# Patient Record
Sex: Male | Born: 1975
Health system: Southern US, Community
[De-identification: ages and names within clinical notes are randomized; demographics above are authoritative.]

## PROBLEM LIST (undated history)

## (undated) DIAGNOSIS — M25519 Pain in unspecified shoulder: Secondary | ICD-10-CM

## (undated) DIAGNOSIS — F419 Anxiety disorder, unspecified: Secondary | ICD-10-CM

## (undated) DIAGNOSIS — F909 Attention-deficit hyperactivity disorder, unspecified type: Secondary | ICD-10-CM

## (undated) DIAGNOSIS — M1990 Unspecified osteoarthritis, unspecified site: Secondary | ICD-10-CM

## (undated) HISTORY — DX: Pain in unspecified shoulder: M25.519

## (undated) HISTORY — PX: NO PAST SURGERIES: SHX2092

---

## 2018-01-04 ENCOUNTER — Encounter: Payer: Self-pay | Admitting: Medical

## 2018-01-04 ENCOUNTER — Ambulatory Visit (INDEPENDENT_AMBULATORY_CARE_PROVIDER_SITE_OTHER): Payer: Commercial Managed Care - PPO | Admitting: Medical

## 2018-01-04 ENCOUNTER — Ambulatory Visit (HOSPITAL_BASED_OUTPATIENT_CLINIC_OR_DEPARTMENT_OTHER)
Admission: RE | Admit: 2018-01-04 | Discharge: 2018-01-04 | Disposition: A | Discharge status: Home / Self Care | Payer: Commercial Managed Care - PPO | Source: Ambulatory Visit | Attending: Medical | Admitting: Medical

## 2018-01-04 VITALS — BP 117/80 | HR 63 | Temp 98.2°F | Resp 16 | Ht 71.0 in | Wt 157.2 lb

## 2018-01-04 DIAGNOSIS — M2550 Pain in unspecified joint: Secondary | ICD-10-CM | POA: Diagnosis not present

## 2018-01-04 DIAGNOSIS — S30861D Insect bite (nonvenomous) of abdominal wall, subsequent encounter: Secondary | ICD-10-CM

## 2018-01-04 DIAGNOSIS — R41840 Attention and concentration deficit: Secondary | ICD-10-CM

## 2018-01-04 DIAGNOSIS — M25531 Pain in right wrist: Secondary | ICD-10-CM

## 2018-01-04 DIAGNOSIS — R5383 Other fatigue: Secondary | ICD-10-CM

## 2018-01-04 DIAGNOSIS — M25511 Pain in right shoulder: Secondary | ICD-10-CM | POA: Diagnosis not present

## 2018-01-04 DIAGNOSIS — M25532 Pain in left wrist: Secondary | ICD-10-CM | POA: Diagnosis not present

## 2018-01-04 DIAGNOSIS — W57XXXA Bitten or stung by nonvenomous insect and other nonvenomous arthropods, initial encounter: Secondary | ICD-10-CM | POA: Diagnosis not present

## 2018-01-04 DIAGNOSIS — G8929 Other chronic pain: Secondary | ICD-10-CM

## 2018-01-04 LAB — CBC WITH DIFFERENTIAL/PLATELET
BASOS ABS: 0 10*3/uL (ref 0.0–0.1)
Basophils Relative: 0.5 % (ref 0.0–3.0)
Eosinophils Absolute: 0 10*3/uL (ref 0.0–0.7)
Eosinophils Relative: 0.9 % (ref 0.0–5.0)
HCT: 43.4 % (ref 39.0–52.0)
Hemoglobin: 15 g/dL (ref 13.0–17.0)
LYMPHS ABS: 1.1 10*3/uL (ref 0.7–4.0)
Lymphocytes Relative: 21 % (ref 12.0–46.0)
MCHC: 34.5 g/dL (ref 30.0–36.0)
MCV: 89.1 fl (ref 78.0–100.0)
MONO ABS: 0.3 10*3/uL (ref 0.1–1.0)
Monocytes Relative: 5.6 % (ref 3.0–12.0)
NEUTROS ABS: 3.7 10*3/uL (ref 1.4–7.7)
NEUTROS PCT: 72 % (ref 43.0–77.0)
PLATELETS: 267 10*3/uL (ref 150.0–400.0)
RBC: 4.87 Mil/uL (ref 4.22–5.81)
RDW: 13.7 % (ref 11.5–15.5)
WBC: 5.2 10*3/uL (ref 4.0–10.5)

## 2018-01-04 LAB — COMPREHENSIVE METABOLIC PANEL
ALK PHOS: 59 U/L (ref 39–117)
ALT: 15 U/L (ref 0–53)
AST: 16 U/L (ref 0–37)
Albumin: 4.2 g/dL (ref 3.5–5.2)
BILIRUBIN TOTAL: 0.9 mg/dL (ref 0.2–1.2)
BUN: 18 mg/dL (ref 6–23)
CO2: 32 meq/L (ref 19–32)
Calcium: 9.2 mg/dL (ref 8.4–10.5)
Chloride: 105 mEq/L (ref 96–112)
Creatinine, Ser: 1.03 mg/dL (ref 0.40–1.50)
GFR: 85.01 mL/min (ref >60.00)
GLUCOSE: 93 mg/dL (ref 70–99)
Potassium: 4 mEq/L (ref 3.5–5.1)
SODIUM: 140 meq/L (ref 135–145)
TOTAL PROTEIN: 6.3 g/dL (ref 6.0–8.3)

## 2018-01-04 LAB — SEDIMENTATION RATE: SED RATE: 1 mm/h (ref 0–15)

## 2018-01-04 LAB — C-REACTIVE PROTEIN

## 2018-01-04 MED ORDER — DICLOFENAC SODIUM 75 MG PO TBEC: 75.0000 mg | DELAYED_RELEASE_TABLET | Freq: Two times a day (BID) | ORAL | 0 refills | Status: DC

## 2018-01-04 NOTE — Patient Instructions (Addendum)
For your history of poor concentration/attention issues since youth, I do want you to be evaluated by behavioral health for ADD testing.  We have given you a copy of the sheet with this a specialist numbers.  I marked on the top of that sheet by our behavioral health.  Please give them a call and initiate the referral.  If there is a delay in getting in with them please let me know and our referral staff can follow-up.  For your history of arthralgias, I have arthritis panel studies to be done today.  For fatigue, I have placed CBC, CMP, TSH, B12 and B1.  For your history of chronic right shoulder pain, I placed x-ray order today.  I also prescribed you diclofenac NSAID.  If you use diclofenac then stop any over-the-counter NSAIDs.  Will review your x-ray and depending on your response to diclofenac, I may refer you to sports medicine.  For history of very tiny bull's-eye appearing rash in July 2017, I did place orders for tick bite studies.  Follow-up in 3 weeks or as needed.

## 2018-01-04 NOTE — Progress Notes (Signed)
Subjective:    Patient ID: Corey Sosa, male    DOB: 12/01/1975, 42 y.o.   MRN: [id number]  HPI  Pt in for follow up.  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 Pt does not work out/exercise. Pt states he eats healthy for most part. Has some coffee in morning. None smoker. Alcohol 1 beer twice a week. Married- 3 children. Pt went to community college went for 4 semester.  Pt states he has history of rt shoulder pain in college. He states he lifted something heavy and felt stabbing pain. 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 Then 2 months ago pain seemed to come back. Now dull ache. States pain rt shoulder if lifts anything more than 15-20 lbs.  Pt also expresses some concern about some diffuse boyaches at times for 8 months with some fatigue. This is with all his joints. Pt does recall seeing rash that looks mild bulls eye like rash January 2017 but does not remember seeing anything. Rt was on rt lower rib area.  Pt also stats his brother has ADD. Pt states he had always had problems with attention and concentration. Also thinks he has problems remembering.     Review of Systems  Constitutional: Positive for fatigue.  Respiratory: Negative for apnea, cough, chest tightness, shortness of breath and wheezing.   Cardiovascular: Negative for chest pain and palpitations.  Gastrointestinal: Negative for abdominal pain and blood in stool.  Musculoskeletal: Positive for arthralgias and myalgias.  Skin: Positive for rash.       Small bulls eye rash about 1/2 inch rt rib area numerous months ago.  Actually was July 2017.  Neurological: Negative for dizziness, seizures, facial asymmetry and numbness.  Hematological: Negative for adenopathy. Does not bruise/bleed easily.  Psychiatric/Behavioral: Negative for behavioral problems, confusion and decreased concentration.   Past Medical History:  Diagnosis Date  .  Shoulder pain      Social History   Socioeconomic History  . Marital status: Married    Spouse name: Not on file  . Number of children: Not on file  . Years of education: Not on file  . Highest education level: Not on file  Social Needs  . Financial resource strain: Not on file  . Food insecurity - worry: Not on file  . Food insecurity - inability: Not on file  . Transportation needs - medical: Not on file  . Transportation needs - non-medical: Not on file  Occupational History  . Not on file  Tobacco Use  . Smoking status: Never Smoker  . Smokeless tobacco: Never Used  Substance and Sexual Activity  . Alcohol use: Yes    Frequency: Never    Comment: 1 beer twice a week.  . Drug use: No  . Sexual activity: Yes  Other Topics Concern  . Not on file  Social History Narrative  . Not on file      Family History  Problem Relation Age of Onset  . Cancer Mother   . Stroke Father   . Heart attack Father     Not on File  No current outpatient medications on file prior to visit.   No current facility-administered medications on file prior to visit.     BP 117/80   Pulse 63   Temp 98.2 F (36.8 C) (Oral)   Resp 16   Ht 5\' 11"  (1.803 m)   Wt 157 lb  3.2 oz (71.3 kg)   SpO2 99%   BMI 21.92 kg/m       Objective:   Physical Exam  General Mental Status- Alert. General Appearance- Not in acute distress.   Skin General: Color- Normal Color. Moisture- Normal Moisture.  Upon inspection no rash presently.  Particularly none in the right lower rib region.  Neck Carotid Arteries- Normal color. Moisture- Normal Moisture. No carotid bruits. No JVD.  Chest and Lung Exam Auscultation: Breath Sounds:-Normal.  Cardiovascular Auscultation:Rythm- Regular. Murmurs & Other Heart Sounds:Auscultation of the heart reveals- No Murmurs.  Abdomen Inspection:-Inspeection Normal. Palpation/Percussion:Note:No mass. Palpation and Percussion of the abdomen reveal- Non Tender,  Non Distended + BS, no rebound or guarding.    Neurologic Cranial Nerve exam:- CN Sosa-XII intact(No nystagmus), symmetric smile. Strength:- 5/5 equal and symmetric strength both upper and lower extremities.   Right shoulder- he does have some tenderness to palpation directly over the anterior shoulder in the region and in the region of the biceps tendon.  Good range of motion but when he abducts his right upper extremity to the shoulder level he does report pain.  Musculoskeletal-upon inspection of wrist and hands no obvious deformity.  Good range of motion of wrist and digits.    Assessment & Plan:  For your history of poor concentration/attention issues since youth, I do want you to be evaluated by behavioral health for ADD testing.  We have given you a copy of the sheet with this a specialist numbers.  I marked on the top of that sheet by our behavioral health.  Please give them a call and initiate the referral.  If there is a delay in getting in with them please let me know and our referral staff can follow-up.  For your history of arthralgias, I have arthritis panel studies to be done today.  For fatigue, I have placed CBC, CMP, TSH, B12 and B1.  For your history of chronic right shoulder pain, I placed x-ray order today.  I also prescribed you diclofenac NSAID.  If you use diclofenac then stop any over-the-counter NSAIDs.  Will review your x-ray and depending on your response to diclofenac, I may refer you to sports medicine.  For history of very tiny bull's-eye appearing rash in July 2017, I did place orders for tick bite studies.  Follow-up in 3 weeks or as needed.

## 2018-01-05 DIAGNOSIS — M2550 Pain in unspecified joint: Secondary | ICD-10-CM | POA: Insufficient documentation

## 2018-01-05 DIAGNOSIS — R41840 Attention and concentration deficit: Secondary | ICD-10-CM | POA: Insufficient documentation

## 2018-01-05 LAB — VITAMIN B12: Vitamin B-12: 243 pg/mL (ref 211–911)

## 2018-01-05 LAB — TSH: TSH: 2.5 u[IU]/mL (ref 0.35–4.50)

## 2018-01-06 ENCOUNTER — Telehealth: Payer: Self-pay | Admitting: Medical

## 2018-01-06 NOTE — Telephone Encounter (Signed)
error 

## 2018-01-07 LAB — ROCKY MTN SPOTTED FVR ABS PNL(IGG+IGM)
RMSF IGM: NOT DETECTED
RMSF IgG: NOT DETECTED

## 2018-01-07 LAB — VITAMIN B1: Vitamin B1 (Thiamine): 13 nmol/L (ref 8–30)

## 2018-01-07 LAB — ANA: ANA: NEGATIVE

## 2018-01-07 LAB — RHEUMATOID FACTOR

## 2018-01-11 ENCOUNTER — Telehealth: Payer: Self-pay | Admitting: Medical

## 2018-01-11 NOTE — Telephone Encounter (Signed)
Will you double check with lab why lyme study was not run? Place reorder for that and please arrange for patient to come and get that done. Let me know what lab says about whey it was not run?

## 2018-01-11 NOTE — Telephone Encounter (Signed)
Looking in patient's chart it looks like the lyme disease study was dc due to test not being collected in lab.

## 2018-01-11 NOTE — Telephone Encounter (Signed)
What happened to lyme disease studies.

## 2018-01-11 NOTE — Telephone Encounter (Signed)
Will you ask lab to reorder lyme study. I will sign and arrange for pt to come in and get blood test.

## 2018-01-19 ENCOUNTER — Other Ambulatory Visit: Payer: Self-pay | Admitting: Emergency Medicine

## 2018-01-19 ENCOUNTER — Telehealth: Payer: Self-pay | Admitting: Medical

## 2018-01-19 ENCOUNTER — Encounter: Payer: Self-pay | Admitting: Medical

## 2018-01-19 DIAGNOSIS — M2550 Pain in unspecified joint: Secondary | ICD-10-CM

## 2018-01-19 NOTE — Telephone Encounter (Signed)
Looking at the patient's labs that were drawn over the past 2 weeks.  Patient had tick bite studies done and not can almost where I remember signing off on Lyme studies.  But those are not back yet?  I had asked Rod Holler to touch base with Mar[facility] [place] about the results since they were pending but did not get a response.  Patient is now wondering why it is taking so long to get the lab study results back.  Will you see if that was ever sent out?Marland Kitchen  Will you touch base with Marita [place] tomorrow.

## 2018-01-19 NOTE — Telephone Encounter (Signed)
Open to review Lyme study order.

## 2018-01-25 ENCOUNTER — Encounter: Payer: Self-pay | Admitting: Medical

## 2018-01-25 ENCOUNTER — Telehealth: Payer: Self-pay | Admitting: Medical

## 2018-01-25 ENCOUNTER — Ambulatory Visit (INDEPENDENT_AMBULATORY_CARE_PROVIDER_SITE_OTHER): Payer: Commercial Managed Care - PPO | Admitting: Medical

## 2018-01-25 VITALS — BP 122/88 | HR 67 | Resp 16 | Ht 71.0 in | Wt 159.8 lb

## 2018-01-25 DIAGNOSIS — S20361A Insect bite (nonvenomous) of right front wall of thorax, initial encounter: Secondary | ICD-10-CM

## 2018-01-25 DIAGNOSIS — Z113 Encounter for screening for infections with a predominantly sexual mode of transmission: Secondary | ICD-10-CM | POA: Diagnosis not present

## 2018-01-25 DIAGNOSIS — M2550 Pain in unspecified joint: Secondary | ICD-10-CM

## 2018-01-25 DIAGNOSIS — R5383 Other fatigue: Secondary | ICD-10-CM

## 2018-01-25 DIAGNOSIS — R0683 Snoring: Secondary | ICD-10-CM | POA: Diagnosis not present

## 2018-01-25 DIAGNOSIS — W57XXXS Bitten or stung by nonvenomous insect and other nonvenomous arthropods, sequela: Secondary | ICD-10-CM

## 2018-01-25 DIAGNOSIS — J029 Acute pharyngitis, unspecified: Secondary | ICD-10-CM | POA: Diagnosis not present

## 2018-01-25 LAB — POCT RAPID STREP A (OFFICE): Rapid Strep A Screen: POSITIVE — AB

## 2018-01-25 LAB — VITAMIN D 25 HYDROXY (VIT D DEFICIENCY, FRACTURES): VITD: 10.42 ng/mL — ABNORMAL LOW (ref 30.00–100.00)

## 2018-01-25 MED ORDER — VITAMIN D (ERGOCALCIFEROL) 1.25 MG (50000 UNIT) PO CAPS: 50000.0000 [IU] | ORAL_CAPSULE | ORAL | 0 refills | Status: DC

## 2018-01-25 MED ORDER — AMOXICILLIN 500 MG PO CAPS: 500.0000 mg | ORAL_CAPSULE | Freq: Three times a day (TID) | ORAL | 0 refills | Status: DC

## 2018-01-25 NOTE — Progress Notes (Signed)
Subjective:    Patient ID: Corey Sosa, male    DOB: 03-10-1976, 42 y.o.   MRN: [id number]  HPI  Pt has appointment with behavioral health on February 05, 2018. For ADD evaluation.  Pt did contact lab about why Lyme studies were not sent out. Pt small rash area has resolved.Pt tick bite was January 2017.   Pt states still having some body aches and still having fatigue. Fatigue despites adequate rest. Arthritis panel work up was negative.  Prior work up for fatigue was negative. I did not include vitamin D level.  Pt does note his body aches are less.  On review patient states that he never got mono.  Pt also mentioned that in the past he did participate in some drug trials.(pt was told he took most of drugs got approved.)   Review of Systems  Constitutional: Positive for fatigue. Negative for activity change, appetite change and fever.  HENT: Positive for sore throat. Negative for congestion, ear pain, nosebleeds and postnasal drip.        About one week ago throat felt swollen for 2 days. Mild pain.   Pt does snore just little bite.  Respiratory: Negative for cough, chest tightness, shortness of breath and wheezing.   Cardiovascular: Negative for chest pain and palpitations.  Musculoskeletal: Negative for back pain.  Skin: Negative for rash.  Neurological: Negative for dizziness, speech difficulty, weakness, numbness and headaches.  Hematological: Negative for adenopathy. Does not bruise/bleed easily.  Psychiatric/Behavioral: Negative for behavioral problems.     Past Medical History:  Diagnosis Date  . Shoulder pain      Social History   Socioeconomic History  . Marital status: Married    Spouse name: Not on file  . Number of children: Not on file  . Years of education: Not on file  . Highest education level: Not on file  Occupational History  . Not on file  Social Needs  . Financial resource strain: Not on file  . Food insecurity:    Worry: Not on  file    Inability: Not on file  . Transportation needs:    Medical: Not on file    Non-medical: Not on file  Tobacco Use  . Smoking status: Never Smoker  . Smokeless tobacco: Never Used  Substance and Sexual Activity  . Alcohol use: Yes    Frequency: Never    Comment: 1 beer twice a week.  . Drug use: No  . Sexual activity: Yes  Lifestyle  . Physical activity:    Days per week: Not on file    Minutes per session: Not on file  . Stress: Not on file  Relationships  . Social connections:    Talks on phone: Not on file    Gets together: Not on file    Attends religious service: Not on file    Active member of club or organization: Not on file    Attends meetings of clubs or organizations: Not on file    Relationship status: Not on file  . Intimate partner violence:    Fear of current or ex partner: Not on file    Emotionally abused: Not on file    Physically abused: Not on file    Forced sexual activity: Not on file  Other Topics Concern  . Not on file  Social History Narrative  . Not on file      Family History  Problem Relation Age of Onset  . Cancer Mother   .  Stroke Father   . Heart attack Father     Not on File  No current outpatient medications on file prior to visit.   No current facility-administered medications on file prior to visit.     BP 122/88 (BP Location: Right Arm, Patient Position: Sitting, Cuff Size: Normal)   Pulse 67   Resp 16   Ht 5\' 11"  (1.803 m)   Wt 159 lb 12.8 oz (72.5 kg)   SpO2 100%   BMI 22.29 kg/m       Objective:   Physical Exam  General Mental Status- Alert. General Appearance- Not in acute distress.   Skin General: Color- Normal Color. Moisture- Normal Moisture.  Neck Carotid Arteries- Normal color. Moisture- Normal Moisture. No carotid bruits. No JVD.  Chest and Lung Exam Auscultation: Breath Sounds:-Normal.  Cardiovascular Auscultation:Rythm- Regular. Murmurs & Other Heart Sounds:Auscultation of the  heart reveals- No Murmurs.  Abdomen Inspection:-Inspeection Normal. Palpation/Percussion:Note:No mass. Palpation and Percussion of the abdomen reveal- Non Tender, Non Distended + BS, no rebound or guarding.   Neurologic Cranial Nerve exam:- CN Sosa-XII intact(No nystagmus), symmetric smile. Strength:- 5/5 equal and symmetric strength both upper and lower extremities.  HEENT- mild red not real swollen   Assessment & Plan:  For hx of remote tick bit will get lyme studies. Sorry for mix up ordering the test.  For fatigue will add vitamin D, and epstein barr test.  Will get hiv screen.  For st one week ago we got rapid strep test was faint positive. Will rx amoxicillin.  Will follow labs and consider snoring as cause. If all test negative.   Follow up date to be determined after lab review.  Mackie Pai, PA-C

## 2018-01-25 NOTE — Patient Instructions (Addendum)
.   For hx of remote tick bit will get lyme studies. Sorry for mix up ordering the test.  For fatigue will add vitamin D, and epstein barr test.  Will get hiv screen.  For st one week ago we got rapid strep test was faint positive. Will rx amoxicillin.  Will follow labs and consider snoring as cause. If all test negative.   Follow up date to be determined after lab review.

## 2018-01-25 NOTE — Telephone Encounter (Signed)
Prescription of vitamin D sent to patient's pharmacy.

## 2018-01-26 LAB — EPSTEIN-BARR VIRUS VCA ANTIBODY PANEL
EBV NA IGG: 174 U/mL — AB
EBV VCA IGG: 639 U/mL — AB
EBV VCA IgM: <36 U/mL

## 2018-01-26 LAB — B. BURGDORFI ANTIBODIES: B burgdorferi Ab IgG+IgM: <0.9 index

## 2018-01-26 LAB — HIV ANTIBODY (ROUTINE TESTING W REFLEX): HIV: NONREACTIVE

## 2018-02-05 ENCOUNTER — Ambulatory Visit (INDEPENDENT_AMBULATORY_CARE_PROVIDER_SITE_OTHER): Payer: Commercial Managed Care - PPO | Admitting: Psychology

## 2018-02-05 DIAGNOSIS — F900 Attention-deficit hyperactivity disorder, predominantly inattentive type: Secondary | ICD-10-CM

## 2018-02-08 ENCOUNTER — Encounter: Payer: Self-pay | Admitting: Medical

## 2018-02-15 ENCOUNTER — Ambulatory Visit (INDEPENDENT_AMBULATORY_CARE_PROVIDER_SITE_OTHER): Payer: Commercial Managed Care - PPO | Admitting: Psychology

## 2018-02-15 DIAGNOSIS — F900 Attention-deficit hyperactivity disorder, predominantly inattentive type: Secondary | ICD-10-CM

## 2018-02-23 ENCOUNTER — Telehealth: Payer: Self-pay | Admitting: Medical

## 2018-02-23 NOTE — Telephone Encounter (Signed)
Did review psychologist

## 2018-02-23 NOTE — Telephone Encounter (Signed)
Did review psychologist note on ADHD evaluation. Will you schedule pt appointment to discuss options. Potentially sign controlled medication contract.

## 2018-02-24 NOTE — Telephone Encounter (Signed)
Could you call and schedule appointment.  

## 2018-02-24 NOTE — Telephone Encounter (Signed)
Left voicemail for pt to call us back to set up an appointment.

## 2018-03-01 ENCOUNTER — Ambulatory Visit (INDEPENDENT_AMBULATORY_CARE_PROVIDER_SITE_OTHER): Payer: Commercial Managed Care - PPO | Admitting: Medical

## 2018-03-01 ENCOUNTER — Encounter: Payer: Self-pay | Admitting: Medical

## 2018-03-01 VITALS — BP 117/86 | HR 64 | Temp 98.2°F | Resp 16 | Ht 71.0 in | Wt 161.2 lb

## 2018-03-01 DIAGNOSIS — F988 Other specified behavioral and emotional disorders with onset usually occurring in childhood and adolescence: Secondary | ICD-10-CM

## 2018-03-01 DIAGNOSIS — Z79899 Other long term (current) drug therapy: Secondary | ICD-10-CM

## 2018-03-01 MED ORDER — METHYLPHENIDATE HCL ER 18 MG PO TB24
18.0000 mg | ORAL_TABLET | Freq: Every day | ORAL | 0 refills | Status: DC
Start: 2018-03-01 — End: 2018-03-29

## 2018-03-01 MED ORDER — VITAMIN D (ERGOCALCIFEROL) 1.25 MG (50000 UNIT) PO CAPS: 50000.0000 [IU] | ORAL_CAPSULE | ORAL | 0 refills | Status: DC

## 2018-03-01 NOTE — Progress Notes (Signed)
Subjective:    Patient ID: Corey Sosa, male    DOB: [birth year], 42 y.o.   MRN: [id number]  HPI  Pt in for follow up.  On review report of psychologist and report indicate he has ADD and that he may  benefit from medication. Pt never been on any medication before for ADD. Pt bp good and no history of palpitations. He does not drink a lot of caffeine beverages.  Pt does not smoke any marijuana. No drugs. Only rare alcohol use.    Review of Systems  Constitutional: Negative for chills, diaphoresis, fatigue and fever.  Respiratory: Negative for cough, chest tightness, shortness of breath and wheezing.   Cardiovascular: Negative for chest pain and palpitations.  Gastrointestinal: Negative for abdominal pain.  Genitourinary: Negative for dysuria, flank pain and frequency.  Musculoskeletal: Negative for back pain and joint swelling.  Skin: Negative for pallor.  Neurological: Negative for dizziness and headaches.  Hematological: Negative for adenopathy. Does not bruise/bleed easily.  Psychiatric/Behavioral: Positive for decreased concentration. Negative for agitation, behavioral problems, self-injury and sleep disturbance. The patient is not nervous/anxious.     Past Medical History:  Diagnosis Date  . Shoulder pain      Social History   Socioeconomic History  . Marital status: Married    Spouse name: Not on file  . Number of children: Not on file  . Years of education: Not on file  . Highest education level: Not on file  Occupational History  . Not on file  Social Needs  . Financial resource strain: Not on file  . Food insecurity:    Worry: Not on file    Inability: Not on file  . Transportation needs:    Medical: Not on file    Non-medical: Not on file  Tobacco Use  . Smoking status: Never Smoker  . Smokeless tobacco: Never Used  Substance and Sexual Activity  . Alcohol use: Yes    Frequency: Never    Comment: 1 beer twice a week.  . Drug use: No  .  Sexual activity: Yes  Lifestyle  . Physical activity:    Days per week: Not on file    Minutes per session: Not on file  . Stress: Not on file  Relationships  . Social connections:    Talks on phone: Not on file    Gets together: Not on file    Attends religious service: Not on file    Active member of club or organization: Not on file    Attends meetings of clubs or organizations: Not on file    Relationship status: Not on file  . Intimate partner violence:    Fear of current or ex partner: Not on file    Emotionally abused: Not on file    Physically abused: Not on file    Forced sexual activity: Not on file  Other Topics Concern  . Not on file  Social History Narrative  . Not on file    No past surgical history on file.  Family History  Problem Relation Age of Onset  . Cancer Mother   . Stroke Father   . Heart attack Father     Not on File  No current outpatient medications on file prior to visit.   No current facility-administered medications on file prior to visit.     BP 117/86 (BP Location: Left Arm, Patient Position: Sitting, Cuff Size: Small)   Pulse 64   Temp 98.2 F (36.8 C) (  Oral)   Resp 16   Ht 5\' 11"  (1.803 m)   Wt 161 lb 3.2 oz (73.1 kg)   SpO2 99%   BMI 22.48 kg/m       Objective:   Physical Exam   General- No acute distress. Pleasant patient. Neck- Full range of motion, no jvd Lungs- Clear, even and unlabored. Heart- regular rate and rhythm. Neurologic- CNII- XII grossly intact.     Assessment & Plan:  For ADD I did prescribe you methylphenidate. You signed contract today and gave UDS.  Will follow up in one month to see response to your medication.  For low vitamin D refilled the RX and repeat vitamin D in one month.  Follow up in one month or as needed  [employer], PA-C

## 2018-03-01 NOTE — Patient Instructions (Signed)
For ADD I did prescribe you methylphenidate. You signed contract today and gave UDS.  Will follow up in one month to see response to your medication.  For low vitamin D refilled the RX and repeat vitamin D in one month.  Follow up in one month or as needed

## 2018-03-02 LAB — PAIN MGMT, PROFILE 8 W/CONF, U
6 Acetylmorphine: NEGATIVE ng/mL (ref <10)
AMPHETAMINES: NEGATIVE ng/mL (ref <500)
Alcohol Metabolites: NEGATIVE ng/mL (ref <500)
Benzodiazepines: NEGATIVE ng/mL (ref <100)
Buprenorphine, Urine: NEGATIVE ng/mL (ref <5)
Cocaine Metabolite: NEGATIVE ng/mL (ref <150)
Creatinine: 65.6 mg/dL
MDMA: NEGATIVE ng/mL (ref <500)
Marijuana Metabolite: NEGATIVE ng/mL (ref <20)
OXIDANT: NEGATIVE ug/mL (ref <200)
OXYCODONE: NEGATIVE ng/mL (ref <100)
Opiates: NEGATIVE ng/mL (ref <100)
pH: 6.58 (ref 4.5–9.0)

## 2018-03-29 ENCOUNTER — Ambulatory Visit (INDEPENDENT_AMBULATORY_CARE_PROVIDER_SITE_OTHER): Payer: Commercial Managed Care - PPO | Admitting: Medical

## 2018-03-29 ENCOUNTER — Encounter: Payer: Self-pay | Admitting: Medical

## 2018-03-29 VITALS — BP 118/81 | HR 82 | Temp 98.1°F | Resp 16 | Ht 71.0 in | Wt 155.6 lb

## 2018-03-29 DIAGNOSIS — F988 Other specified behavioral and emotional disorders with onset usually occurring in childhood and adolescence: Secondary | ICD-10-CM

## 2018-03-29 MED ORDER — METHYLPHENIDATE HCL ER (OSM) 27 MG PO TBCR: 27.0000 mg | EXTENDED_RELEASE_TABLET | ORAL | 0 refills | Status: DC

## 2018-03-29 NOTE — Progress Notes (Signed)
Subjective:    Patient ID: Corey Sosa, male    DOB: 1975-11-26, 42 y.o.   MRN: [id number]  HPI  Pt in for follow up.   Pt states for first 3 days he states could feel significant difference with concerta. It has helped some moderately . He states very efficient in getting thinks done and could concentrate very well first 3 days. He feel like could benefit from higher dose. States after 3 days he was wondering if might need slightly higher dose.  He really did not have a lot of side effects.   Review of Systems  Constitutional: Negative for chills, diaphoresis, fatigue and unexpected weight change.  Respiratory: Negative for apnea, cough, choking, shortness of breath and wheezing.   Cardiovascular: Negative for chest pain and palpitations.  Gastrointestinal: Negative for abdominal pain.  Musculoskeletal: Negative for back pain.  Neurological: Negative for dizziness, speech difficulty, weakness, numbness and headaches.  Hematological: Negative for adenopathy. Does not bruise/bleed easily.  Psychiatric/Behavioral: Positive for decreased concentration. Negative for behavioral problems, dysphoric mood, sleep disturbance and suicidal ideas. The patient is not nervous/anxious and is not hyperactive.     Past Medical History:  Diagnosis Date  . Shoulder pain      Social History   Socioeconomic History  . Marital status: Married    Spouse name: Not on file  . Number of children: Not on file  . Years of education: Not on file  . Highest education level: Not on file  Occupational History  . Not on file  Social Needs  . Financial resource strain: Not on file  . Food insecurity:    Worry: Not on file    Inability: Not on file  . Transportation needs:    Medical: Not on file    Non-medical: Not on file  Tobacco Use  . Smoking status: Never Smoker  . Smokeless tobacco: Never Used  Substance and Sexual Activity  . Alcohol use: Yes    Frequency: Never    Comment: 1  beer twice a week.  . Drug use: No  . Sexual activity: Yes  Lifestyle  . Physical activity:    Days per week: Not on file    Minutes per session: Not on file  . Stress: Not on file  Relationships  . Social connections:    Talks on phone: Not on file    Gets together: Not on file    Attends religious service: Not on file    Active member of club or organization: Not on file    Attends meetings of clubs or organizations: Not on file    Relationship status: Not on file  . Intimate partner violence:    Fear of current or ex partner: Not on file    Emotionally abused: Not on file    Physically abused: Not on file    Forced sexual activity: Not on file  Other Topics Concern  . Not on file  Social History Narrative  . Not on file    No past surgical history on file.  Family History  Problem Relation Age of Onset  . Cancer Mother   . Stroke Father   . Heart attack Father     Not on File  Current Outpatient Medications on File Prior to Visit  Medication Sig Dispense Refill  . methylphenidate 18 MG PO CR tablet Take 1 tablet (18 mg total) by mouth daily. 30 tablet 0  . Vitamin D, Ergocalciferol, (DRISDOL) 50000 units CAPS  capsule Take 1 capsule (50,000 Units total) by mouth every 7 (seven) days. 4 capsule 0   No current facility-administered medications on file prior to visit.     BP 118/81   Pulse 82   Temp 98.1 F (36.7 C) (Oral)   Resp 16   Ht 5\' 11"  (1.803 m)   Wt 155 lb 9.6 oz (70.6 kg)   SpO2 98%   BMI 21.70 kg/m       Objective:   Physical Exam  General Mental Status- Alert. General Appearance- Not in acute distress.   Skin General: Color- Normal Color. Moisture- Normal Moisture.  Neck Carotid Arteries- Normal color. Moisture- Normal Moisture. No carotid bruits. No JVD.  Chest and Lung Exam Auscultation: Breath Sounds:-Normal.  Cardiovascular Auscultation:Rythm- Regular. Murmurs & Other Heart Sounds:Auscultation of the heart reveals- No  Murmurs.  Abdomen Inspection:-Inspeection Normal. Palpation/Percussion:Note:No mass. Palpation and Percussion of the abdomen reveal- Non Tender, Non Distended + BS, no rebound or guarding.  Neurologic Cranial Nerve exam:- CN Sosa-XII intact(No nystagmus), symmetric smile. Drift Test:- No drift. Finger to Nose:- Normal/Intact Strength:- 5/5 equal and symmetric strength both upper and lower extremities.      Assessment & Plan:  ADD is improved but not ideally, so I am increasing dose today. I think you will do well with this dose. Can fill rx on April 01, 2018.  Please my chart me update in one month.    If everything going well will refill med when needed and you can follow up in September or October.  Mackie Pai, PA-C

## 2018-03-29 NOTE — Patient Instructions (Addendum)
ADD is improved but not ideally, so I am increasing dose today. I think you will do well with this dose. Can fill rx on April 01, 2018.  Please my chart me update in one month.    If everything going well will refill med when needed and you can follow up in September or October.

## 2018-05-05 ENCOUNTER — Other Ambulatory Visit: Payer: Self-pay | Admitting: Medical

## 2018-05-06 ENCOUNTER — Encounter: Payer: Self-pay | Admitting: Medical

## 2018-05-06 ENCOUNTER — Telehealth: Payer: Self-pay | Admitting: Medical

## 2018-05-06 MED ORDER — METHYLPHENIDATE HCL ER (OSM) 27 MG PO TBCR
27.0000 mg | EXTENDED_RELEASE_TABLET | ORAL | 0 refills | Status: DC
Start: 2018-05-06 — End: 2018-06-11

## 2018-05-06 MED ORDER — VITAMIN D (ERGOCALCIFEROL) 1.25 MG (50000 UNIT) PO CAPS: 50000.0000 [IU] | ORAL_CAPSULE | ORAL | 5 refills | Status: DC

## 2018-05-06 NOTE — Telephone Encounter (Signed)
Refill concerta sent to pt pharmacy.

## 2018-06-03 ENCOUNTER — Encounter: Payer: Self-pay | Admitting: Medical

## 2018-06-08 ENCOUNTER — Encounter: Payer: Self-pay | Admitting: Medical

## 2018-06-11 ENCOUNTER — Other Ambulatory Visit: Payer: Self-pay | Admitting: Medical

## 2018-06-11 MED ORDER — METHYLPHENIDATE HCL ER (OSM) 27 MG PO TBCR: 27.0000 mg | EXTENDED_RELEASE_TABLET | ORAL | 0 refills | Status: DC

## 2018-06-11 NOTE — Telephone Encounter (Signed)
Did refill pt concerta.

## 2018-06-11 NOTE — Telephone Encounter (Signed)
Copied from [place] [id number]. Topic: Quick Communication - Rx Refill/Question >> Jun 11, 2018  8:32 AM Margot Ables wrote: Medication: methylphenidate (CONCERTA) 27 MG PO CR tablet - pt sent mychart msgs requesting refill on 06/03/18 and 06/08/18. Pt has been out of medication since 06/07/18 and asking why no response.  Has the patient contacted their pharmacy? No - controlled Preferred Pharmacy (with phone number or street name): [id number] - [place], [place] - [address] AT [address] [phone] (Phone) [phone] (Fax)

## 2018-06-11 NOTE — Telephone Encounter (Signed)
Requesting: Concerta 27mg  qday Contract: 2019 UDS: 03/01/18 Last OV: 03/29/18 Next Ov: N/A Last refill: 05/06/18, 30, 0RF Database: no discrepancies noted.  Please advise.

## 2018-07-12 ENCOUNTER — Encounter: Payer: Self-pay | Admitting: Medical

## 2018-07-13 ENCOUNTER — Telehealth: Payer: Self-pay | Admitting: Medical

## 2018-07-13 MED ORDER — METHYLPHENIDATE HCL ER (OSM) 27 MG PO TBCR: 27.0000 mg | EXTENDED_RELEASE_TABLET | ORAL | 0 refills | Status: DC

## 2018-07-13 NOTE — Telephone Encounter (Signed)
Refilled concerta. Asked pt to go ahead and schedule follow up next month.

## 2018-07-13 NOTE — Telephone Encounter (Signed)
Pt is requesting refill on Concerta  Last OV: 05/01/7208 Last Fill: 06/11/2018 #30 and 0RF UDS: 03/01/2018

## 2018-08-05 ENCOUNTER — Telehealth: Payer: Self-pay | Admitting: Medical

## 2018-08-05 ENCOUNTER — Encounter: Payer: Self-pay | Admitting: Medical

## 2018-08-05 ENCOUNTER — Other Ambulatory Visit: Payer: Self-pay

## 2018-08-05 DIAGNOSIS — Z79899 Other long term (current) drug therapy: Secondary | ICD-10-CM

## 2018-08-05 NOTE — Telephone Encounter (Signed)
Patient is in process of getting a new job.  He expresses that on previous job they did urine drug screen and it came back positive for narcotic.  He had a very old prescription for which he used for back pain and his former employer let him go since it was not recent prescription.  Now he is getting higher by new company and they want a letter reassuring that he does not have controlled medication/narcotic abuse problems.  I talk with patient today and stated that he could come in to get a urine drug screen tomorrow and I could review that.  If comes up negative as expected then I could write a letter stating he is under treatment for concentration issues and needs random UDS on occasion.  I can explain that drug screening test have all come back negative and that I do not expect/suspect patient has any substance abuse issues.  So I will review the UDS that he does tomorrow and then go over with him the letter with him once that his prepared.

## 2018-08-05 NOTE — Telephone Encounter (Signed)
Opened to review 

## 2018-08-06 ENCOUNTER — Other Ambulatory Visit (INDEPENDENT_AMBULATORY_CARE_PROVIDER_SITE_OTHER): Payer: Self-pay

## 2018-08-06 DIAGNOSIS — Z79899 Other long term (current) drug therapy: Secondary | ICD-10-CM

## 2018-08-07 LAB — PAIN MGMT, PROFILE 8 W/CONF, U
6 Acetylmorphine: NEGATIVE ng/mL (ref <10)
Alcohol Metabolites: NEGATIVE ng/mL (ref <500)
Amphetamines: NEGATIVE ng/mL (ref <500)
BENZODIAZEPINES: NEGATIVE ng/mL (ref <100)
BUPRENORPHINE, URINE: NEGATIVE ng/mL (ref <5)
Cocaine Metabolite: NEGATIVE ng/mL (ref <150)
Creatinine: 201.7 mg/dL
MDMA: NEGATIVE ng/mL (ref <500)
Marijuana Metabolite: NEGATIVE ng/mL (ref <20)
OPIATES: NEGATIVE ng/mL (ref <100)
Oxidant: NEGATIVE ug/mL (ref <200)
Oxycodone: NEGATIVE ng/mL (ref <100)
PH: 5.55 (ref 4.5–9.0)

## 2018-08-09 ENCOUNTER — Ambulatory Visit: Payer: Commercial Managed Care - PPO | Admitting: Medical

## 2018-08-09 ENCOUNTER — Telehealth: Payer: Self-pay | Admitting: Medical

## 2018-08-09 NOTE — Telephone Encounter (Signed)
Will you take note of printer. If I chose wrong printer will you print it and give to me. thanks

## 2018-08-09 NOTE — Telephone Encounter (Signed)
Printed letter for pt. Not sure if I sent to write printer. So will you reprint if not printed. Then give to me to sign.

## 2018-08-11 ENCOUNTER — Encounter: Payer: Self-pay | Admitting: Medical

## 2018-08-26 ENCOUNTER — Encounter: Payer: Self-pay | Admitting: Medical

## 2018-09-07 ENCOUNTER — Encounter: Payer: Self-pay | Admitting: Medical

## 2018-09-08 ENCOUNTER — Telehealth: Payer: Self-pay | Admitting: Medical

## 2018-09-08 MED ORDER — METHYLPHENIDATE HCL ER (OSM) 27 MG PO TBCR: 27.0000 mg | EXTENDED_RELEASE_TABLET | ORAL | 0 refills | Status: DC

## 2018-09-08 NOTE — Telephone Encounter (Signed)
Refilled pt ADD med. Want him to follow up in one month.

## 2018-09-08 NOTE — Telephone Encounter (Signed)
I sent in concerta to pt pharmacy

## 2018-10-16 ENCOUNTER — Encounter: Payer: Self-pay | Admitting: Medical

## 2018-10-19 ENCOUNTER — Telehealth: Payer: Self-pay | Admitting: Medical

## 2018-10-19 MED ORDER — METHYLPHENIDATE HCL ER (OSM) 27 MG PO TBCR: 27.0000 mg | EXTENDED_RELEASE_TABLET | ORAL | 0 refills | Status: DC

## 2018-10-19 NOTE — Telephone Encounter (Signed)
Rx concerta sent to pt pharmacy. 

## 2018-11-18 ENCOUNTER — Encounter: Payer: Self-pay | Admitting: Medical

## 2018-12-28 ENCOUNTER — Encounter: Payer: Self-pay | Admitting: Medical

## 2018-12-28 ENCOUNTER — Ambulatory Visit: Payer: Self-pay | Admitting: Medical

## 2018-12-28 VITALS — BP 120/84 | HR 78 | Temp 98.2°F | Resp 16 | Ht 71.0 in | Wt 162.8 lb

## 2018-12-28 DIAGNOSIS — F988 Other specified behavioral and emotional disorders with onset usually occurring in childhood and adolescence: Secondary | ICD-10-CM

## 2018-12-28 MED ORDER — METHYLPHENIDATE HCL ER (OSM) 27 MG PO TBCR: 27.0000 mg | EXTENDED_RELEASE_TABLET | ORAL | 0 refills | Status: DC

## 2018-12-28 NOTE — Patient Instructions (Addendum)
Your ADD was well controlled when on concerta. Some concentration issues recently when you were off meds due to insurance lapse. No adverse side effects reported on meds. Will refill you meds again today. Up to date on contract and uds presently.  Recommend that you follow up in May for wellness exam. At that point can update contract again and give uds.  Follow up sooner if needed

## 2018-12-28 NOTE — Progress Notes (Signed)
Subjective:    Patient ID: Corey Sosa, male    DOB: [birth year], 43 y.o.   MRN: [id number]  HPI Pt in today for a follow up.  He has new job working in Mining engineer.  He has history of ADD. He is up to date on his contract for controlled meds. Up to date on uds. Pt last got concerta refill on [id number]. Pt ran out of medication since new job and insurance change. He has insurance that will start on Apr1l, 2020.  When he was on medication he did well with concentration. With med/insurance laps add symptoms returned.  Pt controlled med profile reviewed today. No other controlled meds filled.    Review of Systems  Constitutional: Negative for chills, fatigue and fever.  HENT: Negative for congestion, drooling and ear pain.   Respiratory: Negative for cough, chest tightness, shortness of breath and wheezing.   Cardiovascular: Negative for chest pain and palpitations.  Gastrointestinal: Negative for abdominal distention, abdominal pain, diarrhea and nausea.  Genitourinary: Negative for dysuria, frequency and genital sores.  Musculoskeletal: Negative for back pain, joint swelling and neck stiffness.  Skin: Negative for rash.  Neurological: Negative for dizziness, speech difficulty, weakness and headaches.  Hematological: Negative for adenopathy. Does not bruise/bleed easily.  Psychiatric/Behavioral: Positive for decreased concentration. Negative for behavioral problems, dysphoric mood, hallucinations, sleep disturbance and suicidal ideas. The patient is not nervous/anxious and is not hyperactive.     Past Medical History:  Diagnosis Date  . Shoulder pain      Social History   Socioeconomic History  . Marital status: Married    Spouse name: Not on file  . Number of children: Not on file  . Years of education: Not on file  . Highest education level: Not on file  Occupational History  . Not on file  Social Needs  . Financial resource strain: Not on file  . Food  insecurity:    Worry: Not on file    Inability: Not on file  . Transportation needs:    Medical: Not on file    Non-medical: Not on file  Tobacco Use  . Smoking status: Never Smoker  . Smokeless tobacco: Never Used  Substance and Sexual Activity  . Alcohol use: Yes    Frequency: Never    Comment: 1 beer twice a week.  . Drug use: No  . Sexual activity: Yes  Lifestyle  . Physical activity:    Days per week: Not on file    Minutes per session: Not on file  . Stress: Not on file  Relationships  . Social connections:    Talks on phone: Not on file    Gets together: Not on file    Attends religious service: Not on file    Active member of club or organization: Not on file    Attends meetings of clubs or organizations: Not on file    Relationship status: Not on file  . Intimate partner violence:    Fear of current or ex partner: Not on file    Emotionally abused: Not on file    Physically abused: Not on file    Forced sexual activity: Not on file  Other Topics Concern  . Not on file  Social History Narrative  . Not on file    No past surgical history on file.  Family History  Problem Relation Age of Onset  . Cancer Mother   . Stroke Father   . Heart attack Father  Not on File  Current Outpatient Medications on File Prior to Visit  Medication Sig Dispense Refill  . methylphenidate (CONCERTA) 27 MG PO CR tablet Take 1 tablet (27 mg total) by mouth every morning. Can give brand. 30 tablet 0  . Vitamin D, Ergocalciferol, (DRISDOL) 50000 units CAPS capsule Take 1 capsule (50,000 Units total) by mouth every 7 (seven) days. 4 capsule 5   No current facility-administered medications on file prior to visit.     BP 120/84   Pulse 78   Temp 98.2 F (36.8 C) (Oral)   Resp 16   Ht 5\' 11"  (1.803 m)   Wt 162 lb 12.8 oz (73.8 kg)   SpO2 98%   BMI 22.71 kg/m       Objective:   Physical Exam  General Mental Status- Alert. General Appearance- Not in acute  distress.   Skin General: Color- Normal Color. Moisture- Normal Moisture.  Neck Carotid Arteries- Normal color. Moisture- Normal Moisture. No carotid bruits. No JVD.  Chest and Lung Exam Auscultation: Breath Sounds:-Normal.  Cardiovascular Auscultation:Rythm- Regular. Murmurs & Other Heart Sounds:Auscultation of the heart reveals- No Murmurs.  Abdomen Inspection:-Inspeection Normal. Palpation/Percussion:Note:No mass. Palpation and Percussion of the abdomen reveal- Non Tender, Non Distended + BS, no rebound or guarding.   Neurologic Cranial Nerve exam:- CN Sosa-XII intact(No nystagmus), symmetric smile. Strength:- 5/5 equal and symmetric strength both upper and lower extremities.     Assessment & Plan:  Your ADD was well controlled when on concerta. Some concentration issues recently when you were off meds due to insurance lapse. No adverse side effects reported on meds. Will refill you meds again today. Up to date on contract and uds presently.  Recommend that you follow up in May for wellness exam. At that point can update contract again and give uds.  Follow 2 months  up sooner if needed  [employer], PA-C

## 2019-01-25 ENCOUNTER — Encounter: Payer: Self-pay | Admitting: Medical

## 2019-01-26 ENCOUNTER — Telehealth: Payer: Self-pay | Admitting: Medical

## 2019-01-26 MED ORDER — METHYLPHENIDATE HCL ER (OSM) 27 MG PO TBCR: 27.0000 mg | EXTENDED_RELEASE_TABLET | ORAL | 0 refills | Status: DC

## 2019-01-26 NOTE — Telephone Encounter (Signed)
Rx concerta sent to pt pharmacy.

## 2019-02-28 ENCOUNTER — Other Ambulatory Visit: Payer: Self-pay

## 2019-02-28 ENCOUNTER — Encounter: Payer: Self-pay | Admitting: Medical

## 2019-02-28 ENCOUNTER — Ambulatory Visit (INDEPENDENT_AMBULATORY_CARE_PROVIDER_SITE_OTHER): Payer: Medicaid Other | Admitting: Medical

## 2019-02-28 VITALS — BP 117/78 | HR 60 | Wt 160.0 lb

## 2019-02-28 DIAGNOSIS — F988 Other specified behavioral and emotional disorders with onset usually occurring in childhood and adolescence: Secondary | ICD-10-CM

## 2019-02-28 MED ORDER — METHYLPHENIDATE HCL ER (OSM) 27 MG PO TBCR: 27.0000 mg | EXTENDED_RELEASE_TABLET | ORAL | 0 refills | Status: DC

## 2019-02-28 NOTE — Patient Instructions (Addendum)
Patient has ADD and doing well with current medication regimen.  No reported adverse side effects other than mild decreased appetite.  He is due for refill of his concerta having run out of med about 3 days ago. Up to date on uds and contract. Reviewed controlled med website today.  Sent new rx concerta in.  Asked pt to schedule follow up late July or August for cpe/wellness

## 2019-02-28 NOTE — Progress Notes (Signed)
   Subjective:    Patient ID: Corey Sosa, male    DOB: 01/05/1976, 43 y.o.   MRN: [id number]  HPI  Virtual Visit via Video Note  I connected with Corey Sosa on 02/28/19 at  4:00 PM EDT by a video enabled telemedicine application and verified that I am speaking with the correct person using two identifiers.  Location: Patient: home Provider: office.   I discussed the limitations of evaluation and management by telemedicine and the availability of in person appointments. The patient expressed understanding and agreed to proceed.   History of Present Illness:   Pt doing well on ADD medication with no side effects. Only slight decreased appetitie. No high blood pressure. No palpitation. Weight is stable. He states medcation working well to help with concentration. He is satisfied with current dose.   Observations/Objective: No acute distress. Pleasant speaking normally. Alert and oriented. Lungs- unlabored on inspection. Neuro- normal gross motor function appears intact.  Assessment and Plan:   Follow Up Instructions:    I discussed the assessment and treatment plan with the patient. The patient was provided an opportunity to ask questions and all were answered. The patient agreed with the plan and demonstrated an understanding of the instructions.   The patient was advised to call back or seek an in-person evaluation if the symptoms worsen or if the condition fails to improve as anticipated.     Mackie Pai, PA-C   Review of Systems     Objective:   Physical Exam        Assessment & Plan:

## 2019-03-01 ENCOUNTER — Telehealth: Payer: Self-pay | Admitting: Medical

## 2019-03-01 NOTE — Telephone Encounter (Signed)
LVM FOR PT TO CALL AND SCHEDULE CPE

## 2019-04-05 ENCOUNTER — Encounter: Payer: Self-pay | Admitting: Medical

## 2019-04-06 ENCOUNTER — Telehealth: Payer: Self-pay | Admitting: Medical

## 2019-04-06 MED ORDER — METHYLPHENIDATE HCL ER (OSM) 27 MG PO TBCR: 27.0000 mg | EXTENDED_RELEASE_TABLET | ORAL | 0 refills | Status: DC

## 2019-04-06 NOTE — Telephone Encounter (Signed)
Refilled pt concerta.

## 2019-05-05 ENCOUNTER — Telehealth: Payer: Self-pay | Admitting: Medical

## 2019-05-05 ENCOUNTER — Encounter: Payer: Self-pay | Admitting: Medical

## 2019-05-05 MED ORDER — METHYLPHENIDATE HCL ER (OSM) 27 MG PO TBCR: 27.0000 mg | EXTENDED_RELEASE_TABLET | ORAL | 0 refills | Status: DC

## 2019-05-05 NOTE — Telephone Encounter (Signed)
Refilled concerta. Tried to review [employer] data [employer]. Internal service error showed. No meds? Not sure what that means. Web site down?

## 2019-06-09 ENCOUNTER — Encounter: Payer: Self-pay | Admitting: Medical

## 2019-06-14 MED ORDER — METHYLPHENIDATE HCL ER (OSM) 27 MG PO TBCR: 27.0000 mg | EXTENDED_RELEASE_TABLET | ORAL | 0 refills | Status: DC

## 2019-06-14 NOTE — Addendum Note (Signed)
Addended by: Hinton Dyer on: 06/14/2019 02:30 PM   Modules accepted: Orders

## 2019-06-15 ENCOUNTER — Telehealth: Payer: Self-pay | Admitting: Medical

## 2019-06-15 MED ORDER — METHYLPHENIDATE HCL ER (OSM) 27 MG PO TBCR: 27.0000 mg | EXTENDED_RELEASE_TABLET | ORAL | 0 refills | Status: DC

## 2019-06-15 NOTE — Telephone Encounter (Signed)
Rx concerta sent to pharmacy.

## 2019-06-23 ENCOUNTER — Telehealth: Payer: Self-pay | Admitting: Medical

## 2019-06-23 NOTE — Telephone Encounter (Signed)
LVM for pt to reschedule appt on 08-01, provider not going to be in office in the am, can reschedule on sameday in the afternoon in person appt. If not can reschedule another day at pt convenience.

## 2019-06-28 ENCOUNTER — Ambulatory Visit: Payer: Medicaid Other | Admitting: Medical

## 2019-06-28 ENCOUNTER — Other Ambulatory Visit: Payer: Self-pay

## 2019-06-28 ENCOUNTER — Encounter: Payer: Self-pay | Admitting: Medical

## 2019-06-28 ENCOUNTER — Ambulatory Visit (INDEPENDENT_AMBULATORY_CARE_PROVIDER_SITE_OTHER): Payer: Medicaid Other | Admitting: Medical

## 2019-06-28 VITALS — BP 117/79 | HR 61

## 2019-06-28 DIAGNOSIS — F988 Other specified behavioral and emotional disorders with onset usually occurring in childhood and adolescence: Secondary | ICD-10-CM

## 2019-06-28 MED ORDER — CARVEDILOL 25 MG PO TABS: 25.0000 mg | ORAL_TABLET | Freq: Two times a day (BID) | ORAL | 3 refills | Status: DC

## 2019-06-28 NOTE — Progress Notes (Signed)
   Subjective:    Patient ID: Corey Sosa, male    DOB: February 13, 1976, 43 y.o.   MRN: [id number]  HPI  Virtual Visit via Video Note  I connected with Corey Sosa on 06/28/19 at  2:40 PM EDT by a video enabled telemedicine application and verified that I am speaking with the correct person using two identifiers.  Location: Patient: work Provider: office   I discussed the limitations of evaluation and management by telemedicine and the availability of in person appointments. The patient expressed understanding and agreed to proceed.  Pt up to date on drug screen. Up to date on contract.  History of Present Illness:  Pt states concentration is good. No significant side effect other than decreased side effect.   Observations/Objective: General-no acute distress, pleasant, oriented. Lungs- on inspection lungs appear unlabored. Neck- no tracheal deviation or jvd on inspection. Neuro- gross motor function appears intact.    Assessment and Plan:  ADHD well controlled. Continue concerta. Refill when due.  Do want you to get scheduled for update of contract and uds mid October.  Also that day we can get flu vaccine but when call to schedule contract update explain you want to get flu vaccine same day.  Follow up date for next controlled med visit in 4 months or as needed Follow Up Instructions:    I discussed the assessment and treatment plan with the patient. The patient was provided an opportunity to ask questions and all were answered. The patient agreed with the plan and demonstrated an understanding of the instructions.   The patient was advised to call back or seek an in-person evaluation if the symptoms worsen or if the condition fails to improve as anticipated.  I provided 15  minutes of non-face-to-face time during this encounter.   Mackie Pai, PA-C   Review of Systems  Constitutional: Negative for fatigue and fever.  Respiratory: Negative for  cough, chest tightness, shortness of breath and wheezing.   Cardiovascular: Negative for chest pain and palpitations.  Gastrointestinal: Negative for abdominal pain.  Neurological: Negative for dizziness, speech difficulty and light-headedness.  Hematological: Negative for adenopathy. Does not bruise/bleed easily.  Psychiatric/Behavioral: Positive for decreased concentration. Negative for dysphoric mood, sleep disturbance and suicidal ideas. The patient is not nervous/anxious.        But does well on meds.       Objective:   Physical Exam        Assessment & Plan:

## 2019-06-28 NOTE — Patient Instructions (Addendum)
ADHD well controlled. Continue concerta. Refill when due.  Do want you to get scheduled for update of contract and uds mid October.  Also that day we can get flu vaccine but when call to schedule contract update explain you want to get flu vaccine same day.  Follow up date for next controlled med visit in 4 months or as needed

## 2019-07-18 ENCOUNTER — Encounter: Payer: Self-pay | Admitting: Medical

## 2019-07-19 ENCOUNTER — Telehealth: Payer: Self-pay | Admitting: Medical

## 2019-07-19 ENCOUNTER — Encounter: Payer: Self-pay | Admitting: Medical

## 2019-07-19 DIAGNOSIS — Z79899 Other long term (current) drug therapy: Secondary | ICD-10-CM

## 2019-07-19 MED ORDER — METHYLPHENIDATE HCL ER (OSM) 27 MG PO TBCR: 27.0000 mg | EXTENDED_RELEASE_TABLET | ORAL | 0 refills | Status: DC

## 2019-07-19 NOTE — Telephone Encounter (Signed)
Refill Request: Concerta  Last [id number] Last OV:06/28/19 Next [id number] scheduled  UDS:08/06/18 CSC:08/06/18 CSR:

## 2019-07-19 NOTE — Telephone Encounter (Signed)
Please get pt scheduled to sign controlled med contract and to give uds. Needs to be done by Aug 07, 2019.  No appointment with me needed See if he wants to get flu vaccine that day also.

## 2019-07-20 NOTE — Telephone Encounter (Signed)
Edward sent [place] to pt to schedule lab/UDS and needs to sign new contract. I LM as well to remind pt.

## 2019-07-20 NOTE — Addendum Note (Signed)
Addended by: Hinton Dyer on: 07/20/2019 11:08 AM   Modules accepted: Orders

## 2019-07-20 NOTE — Telephone Encounter (Signed)
Pt needs labs appointment for UDS

## 2019-07-26 ENCOUNTER — Other Ambulatory Visit: Payer: Self-pay

## 2019-07-27 ENCOUNTER — Ambulatory Visit (INDEPENDENT_AMBULATORY_CARE_PROVIDER_SITE_OTHER): Payer: Medicaid Other | Admitting: Medical

## 2019-07-27 DIAGNOSIS — Z23 Encounter for immunization: Secondary | ICD-10-CM | POA: Diagnosis not present

## 2019-07-27 DIAGNOSIS — Z79899 Other long term (current) drug therapy: Secondary | ICD-10-CM | POA: Diagnosis not present

## 2019-07-27 DIAGNOSIS — F988 Other specified behavioral and emotional disorders with onset usually occurring in childhood and adolescence: Secondary | ICD-10-CM

## 2019-07-27 DIAGNOSIS — M5442 Lumbago with sciatica, left side: Secondary | ICD-10-CM | POA: Diagnosis not present

## 2019-07-27 NOTE — Patient Instructions (Addendum)
You are doing well with concerta for ADD. Update contract today and uds today. Continue concerta.  If back pain worsens and more constant then can offer lumbar spine. By desciption pain appears occasional sciatica. Will put in future lumbar spine xray. Can get today if you want.(or you can call [facility] and ask for copy of your old xray done related to work injury) Can continue conservative measures.  Follow up 4 months or as needed.

## 2019-07-27 NOTE — Progress Notes (Signed)
Subjective:    Patient ID: Corey Sosa, male    DOB: 10/26/1976, 43 y.o.   MRN: [id number]  HPI  Patient here for routine follow-up for ADD. He is taking Concernta 27mg  and reports it is working well. No desire to change medications or dosages. Reports only side effect is decreased appetite. He drinks a protein shake for breakfast, tries to eat lunch most days, and normal dinner. Reports weight is maintaining.  Flu shot today.  Pt up to date on uds and contract done today.   Review of Systems  Constitutional: Negative for chills, fatigue, fever and unexpected weight change.  HENT: Negative for congestion and sore throat.   Respiratory: Negative for chest tightness and shortness of breath.   Cardiovascular: Negative for chest pain and palpitations.  Gastrointestinal: Negative for abdominal pain, constipation and diarrhea.  Genitourinary: Negative for dysuria, frequency and urgency.  Musculoskeletal:       Chronic back aches, relieved occasional ibuprofen. Intermittent and self limiting for 2-3 days. No pain today.  On and off for 4 years.  Neurological: Negative for dizziness, light-headedness and headaches.  Psychiatric/Behavioral: Negative for confusion, dysphoric mood, self-injury and sleep disturbance. The patient is not nervous/anxious.     Past Medical History:  Diagnosis Date  . Shoulder pain      Social History   Socioeconomic History  . Marital status: Married    Spouse name: Not on file  . Number of children: Not on file  . Years of education: Not on file  . Highest education level: Not on file  Occupational History  . Not on file  Social Needs  . Financial resource strain: Not on file  . Food insecurity    Worry: Not on file    Inability: Not on file  . Transportation needs    Medical: Not on file    Non-medical: Not on file  Tobacco Use  . Smoking status: Never Smoker  . Smokeless tobacco: Never Used  Substance and Sexual Activity  .  Alcohol use: Yes    Frequency: Never    Comment: 1 beer twice a week.  . Drug use: No  . Sexual activity: Yes  Lifestyle  . Physical activity    Days per week: Not on file    Minutes per session: Not on file  . Stress: Not on file  Relationships  . Social Herbalist on phone: Not on file    Gets together: Not on file    Attends religious service: Not on file    Active member of club or organization: Not on file    Attends meetings of clubs or organizations: Not on file    Relationship status: Not on file  . Intimate partner violence    Fear of current or ex partner: Not on file    Emotionally abused: Not on file    Physically abused: Not on file    Forced sexual activity: Not on file  Other Topics Concern  . Not on file  Social History Narrative  . Not on file    No past surgical history on file.  Family History  Problem Relation Age of Onset  . Cancer Mother   . Stroke Father   . Heart attack Father     Not on File  Current Outpatient Medications on File Prior to Visit  Medication Sig Dispense Refill  . methylphenidate (CONCERTA) 27 MG PO CR tablet Take 1 tablet (27 mg total) by  mouth every morning. Can give brand. 30 tablet 0   No current facility-administered medications on file prior to visit.     There were no vitals taken for this visit.      Objective:   Physical Exam   General Mental Status- Alert. General Appearance- Not in acute distress.   Skin General: Color- Normal Color. Moisture- Normal Moisture.  Neck Carotid Arteries- Normal color. Moisture- Normal Moisture. No carotid bruits. No JVD.  Chest and Lung Exam Auscultation: Breath Sounds:-Normal.  Cardiovascular Auscultation:Rythm- Regular. Murmurs & Other Heart Sounds:Auscultation of the heart reveals- No Murmurs.  Neurologic Cranial Nerve exam:- CN Sosa-XII intact(No nystagmus), symmetric smile. Strength:- 5/5 equal and symmetric strength both upper and lower  extremities.  Back- no obvious tenderness presently. No pain on straight leg lift.     Assessment & Plan:  You are doing well with concerta for ADD. Update contract today and uds today. Continue concerta.  If back pain worsens and more constant then can offer lumbar spine. By desciption pain appears occasional sciatica. Will put in future lumbar spine xray. Can get today if you want.(or you can call [facility] and ask for copy of your old xray done related to work injury) Can continue conservative measures.  Follow up 4 months or as needed.  25 minutes spent with pt counseling on plan going forward.  Mackie Pai, PA-C

## 2019-08-22 ENCOUNTER — Encounter: Payer: Self-pay | Admitting: Medical

## 2019-08-22 ENCOUNTER — Telehealth: Payer: Self-pay | Admitting: Medical

## 2019-08-22 MED ORDER — METHYLPHENIDATE HCL ER (OSM) 27 MG PO TBCR: 27.0000 mg | EXTENDED_RELEASE_TABLET | ORAL | 0 refills | Status: DC

## 2019-08-22 NOTE — Telephone Encounter (Signed)
Refill Request: Methylphenidate   Last RX: 07/19/19 Last OV: 07/27/19 Next OV: 11/28/19 UDS: 07/17/19 CSC: 07/17/19  CSR:

## 2019-08-22 NOTE — Telephone Encounter (Signed)
Rx concerta. Sent to pt pharmacy. Checked controlled med site today. Pt up to date on uds and contract.

## 2019-09-23 ENCOUNTER — Encounter: Payer: Self-pay | Admitting: Medical

## 2019-09-26 MED ORDER — METHYLPHENIDATE HCL ER (OSM) 27 MG PO TBCR: 27.0000 mg | EXTENDED_RELEASE_TABLET | ORAL | 0 refills | Status: DC

## 2019-09-26 NOTE — Telephone Encounter (Signed)
Refill Request: Concerta   Last [id number] Last OV:07/27/19 Next OV:11/28/19 UDS:07/27/19 CSC: 07/27/19 CSR:

## 2019-10-27 ENCOUNTER — Other Ambulatory Visit: Payer: Self-pay | Admitting: Medical

## 2019-10-27 NOTE — Telephone Encounter (Signed)
Requesting:concerta [id number] UDS:08/06/2018 Last Visit:07/27/2019 Next Visit:[id number] Last Refill:09/26/2019  Please Advise

## 2019-10-28 MED ORDER — METHYLPHENIDATE HCL ER (OSM) 27 MG PO TBCR: 27.0000 mg | EXTENDED_RELEASE_TABLET | ORAL | 0 refills | Status: DC

## 2019-10-28 NOTE — Telephone Encounter (Signed)
I refilled the concerta. But he needs to have uds updated. Looks like there is a future order in epic. Please get him scheduled to get one before next month.

## 2019-11-28 ENCOUNTER — Other Ambulatory Visit: Payer: Self-pay

## 2019-11-28 ENCOUNTER — Ambulatory Visit (INDEPENDENT_AMBULATORY_CARE_PROVIDER_SITE_OTHER): Payer: Self-pay | Admitting: Medical

## 2019-11-28 ENCOUNTER — Encounter: Payer: Self-pay | Admitting: Medical

## 2019-11-28 VITALS — BP 117/79 | HR 76 | Temp 98.0°F | Ht 72.0 in | Wt 160.0 lb

## 2019-11-28 DIAGNOSIS — F988 Other specified behavioral and emotional disorders with onset usually occurring in childhood and adolescence: Secondary | ICD-10-CM

## 2019-11-28 DIAGNOSIS — Z79899 Other long term (current) drug therapy: Secondary | ICD-10-CM

## 2019-11-28 MED ORDER — METHYLPHENIDATE HCL ER (OSM) 27 MG PO TBCR: 27.0000 mg | EXTENDED_RELEASE_TABLET | ORAL | 0 refills | Status: DC

## 2019-11-28 NOTE — Progress Notes (Addendum)
Subjective:    Patient ID: Corey Sosa, male    DOB: 1976-03-31, 44 y.o.   MRN: [id number]  HPI  Virtual Visit via Video Note  I connected with Corey Sosa on 11/29/19 at 11:20 AM EST by a video enabled telemedicine application and verified that I am speaking with the correct person using two identifiers.  Location: Patient: home Provider: office   I discussed the limitations of evaluation and management by telemedicine and the availability of in person appointments. The patient expressed understanding and agreed to proceed.   I discussed the assessment and treatment plan with the patient. The patient was provided an opportunity to ask questions and all were answered. The patient agreed with the plan and demonstrated an understanding of the instructions.   The patient was advised to call back or seek an in-person evaluation if the symptoms worsen or if the condition fails to improve as anticipated.  I provided 20  minutes of non-face-to-face time during this encounter.   Arturo Sofranko, PA-C      hpi:  Patient has routine follow-up for ADD. He is taking Concernta 27mg  and reports it is working well. No desire to change medications or dosages. Reports only side effect is decreased appetite. He drinks a protein shake for breakfast, tries to eat lunch most days, and normal dinner. Reports weight is maintaining.     Review of Systems  Constitutional: Negative for chills, fatigue and fever.  Respiratory: Negative for cough, chest tightness, shortness of breath and wheezing.   Cardiovascular: Negative for chest pain and palpitations.  Gastrointestinal: Negative for abdominal pain.  Neurological: Negative for dizziness.  Hematological: Negative for adenopathy.  Psychiatric/Behavioral: Positive for decreased concentration. Negative for behavioral problems, dysphoric mood and self-injury. The patient is not nervous/anxious.        But better with meds     Past Medical History:  Diagnosis Date  . Shoulder pain      Social History   Socioeconomic History  . Marital status: Married    Spouse name: Not on file  . Number of children: Not on file  . Years of education: Not on file  . Highest education level: Not on file  Occupational History  . Not on file  Tobacco Use  . Smoking status: Never Smoker  . Smokeless tobacco: Never Used  Substance and Sexual Activity  . Alcohol use: Yes    Comment: 1 beer twice a week.  . Drug use: No  . Sexual activity: Yes  Other Topics Concern  . Not on file  Social History Narrative  . Not on file   Social Determinants of Health   Financial Resource Strain:   . Difficulty of Paying Living Expenses: Not on file  Food Insecurity:   . Worried About Charity fundraiser in the Last Year: Not on file  . Ran Out of Food in the Last Year: Not on file  Transportation Needs:   . Lack of Transportation (Medical): Not on file  . Lack of Transportation (Non-Medical): Not on file  Physical Activity:   . Days of Exercise per Week: Not on file  . Minutes of Exercise per Session: Not on file  Stress:   . Feeling of Stress : Not on file  Social Connections:   . Frequency of Communication with Friends and Family: Not on file  . Frequency of Social Gatherings with Friends and Family: Not on file  . Attends Religious Services: Not on file  .  Active Member of Clubs or Organizations: Not on file  . Attends Archivist Meetings: Not on file  . Marital Status: Not on file  Intimate Partner Violence:   . Fear of Current or Ex-Partner: Not on file  . Emotionally Abused: Not on file  . Physically Abused: Not on file  . Sexually Abused: Not on file    No past surgical history on file.  Family History  Problem Relation Age of Onset  . Cancer Mother   . Stroke Father   . Heart attack Father     No Known Allergies  No current outpatient medications on file prior to visit.   No current  facility-administered medications on file prior to visit.    BP 117/79 (BP Location: Left Arm, Patient Position: Sitting, Cuff Size: Normal)   Pulse 76   Temp 98 F (36.7 C) (Tympanic)   Ht 6' (1.829 m)   Wt 160 lb (72.6 kg)   BMI 21.70 kg/m       Objective:   Physical Exam  General-no acute distress, pleasant, oriented. Lungs- on inspection lungs appear unlabored. Neck- no tracheal deviation or jvd on inspection. Neuro- gross motor function appears intact.      Assessment & Plan:  ADD controlled. Due for refill of concerta. Sent rx in today. Contract up to date but needs uds done before next refill due. Asking staff to call patient to get him scheduled.  Follow up date in 4 months or as needed  Sending copy of note for supervisor to review.   Mackie Pai, PA-C

## 2019-11-28 NOTE — Patient Instructions (Signed)
ADD controlled. Due for refill of concerta. Sent rx in today. Contract up to date but needs uds done before next refill due. Asking staff to call patient to get him scheduled.  Follow up date in 4 months or as needed

## 2019-11-30 ENCOUNTER — Other Ambulatory Visit: Payer: Medicaid Other

## 2019-12-04 IMAGING — DX DG SHOULDER 2+V*R*
3 series · 3 of 3 positions shown · non-contrast
Comparison: None.

CLINICAL DATA: Chronic right shoulder pain.

EXAM:
RIGHT SHOULDER - 2+ VIEW

[shoulder grashey]
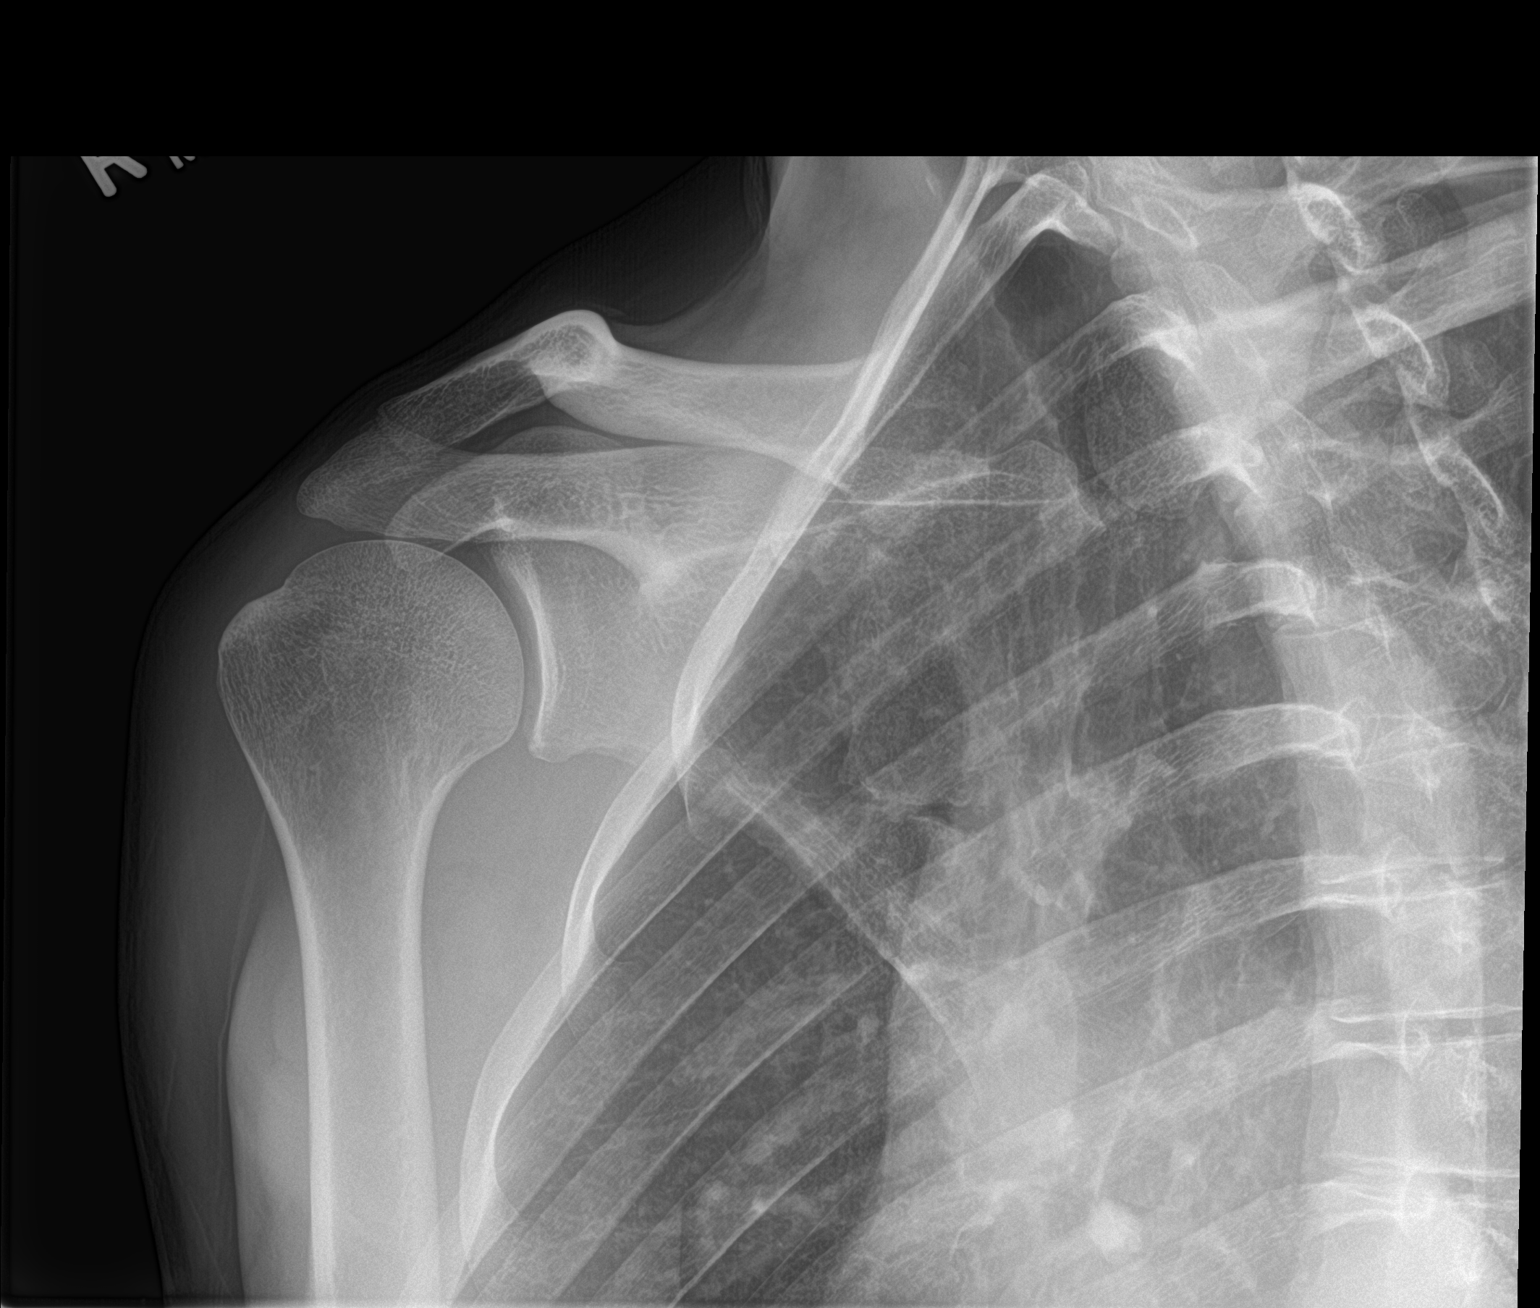

[shoulder y view]
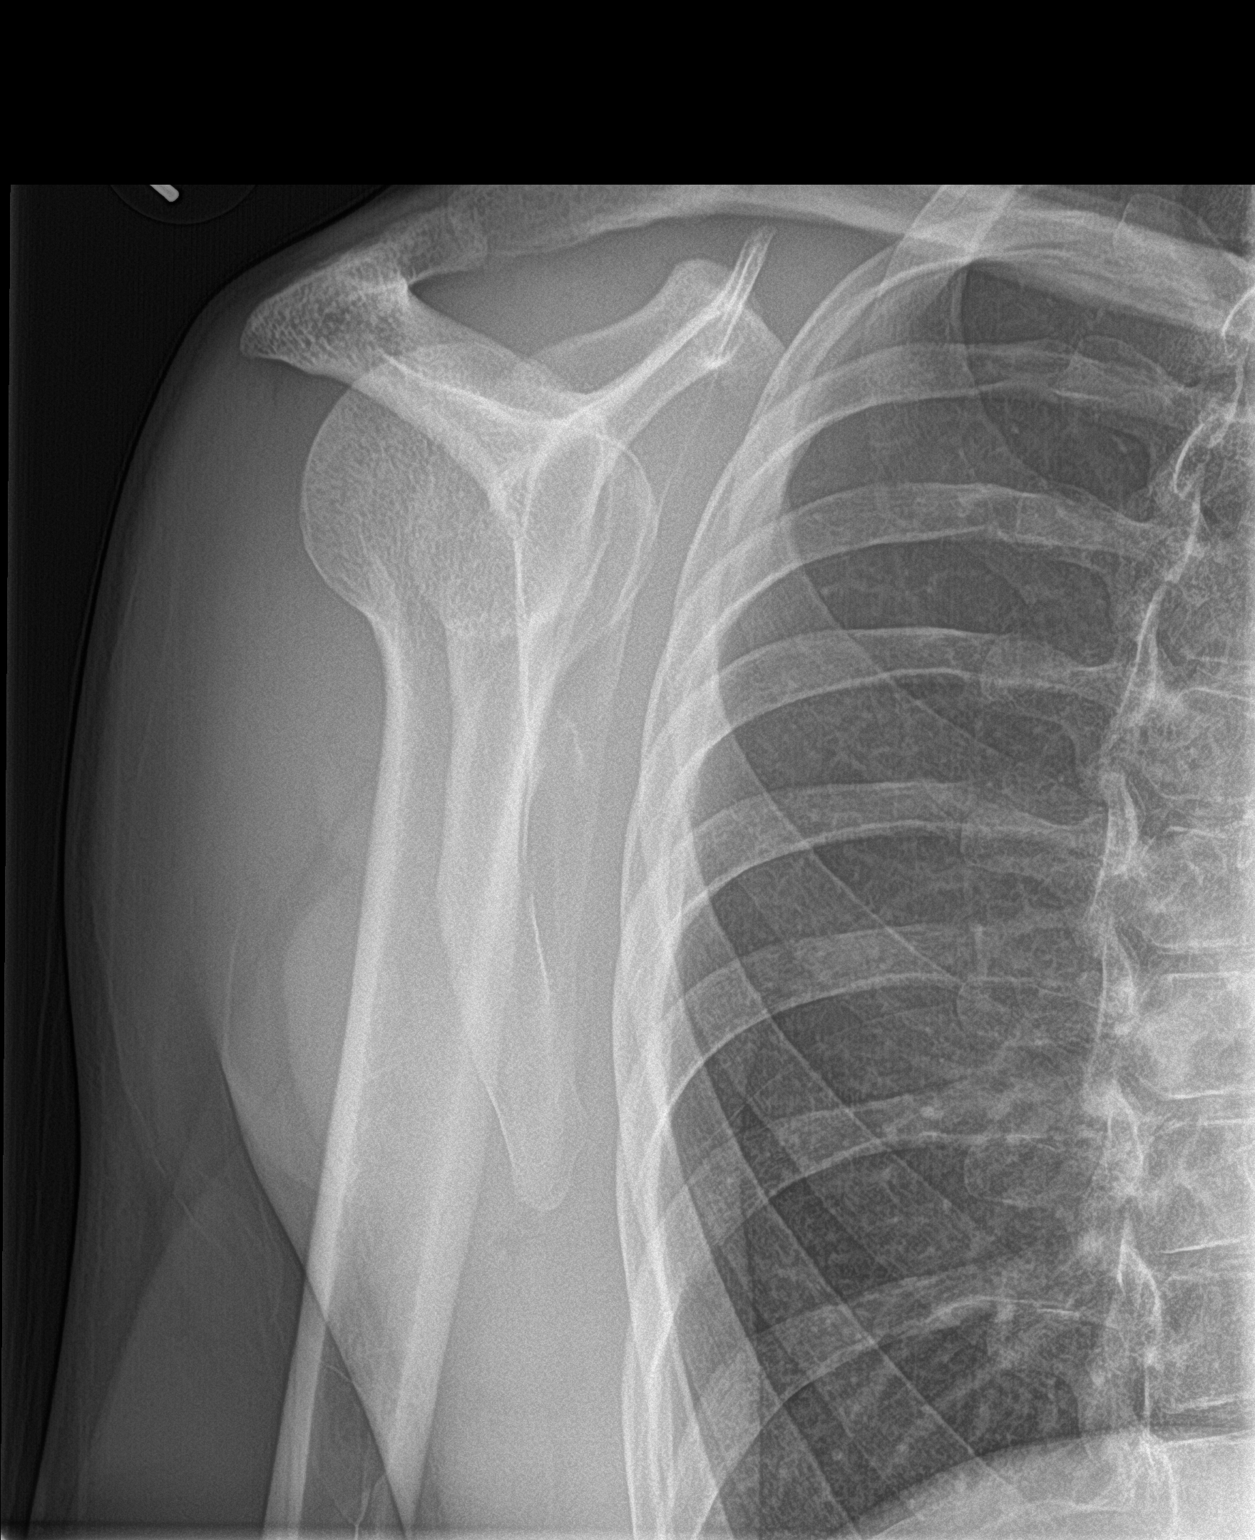

[shoulder axillary]
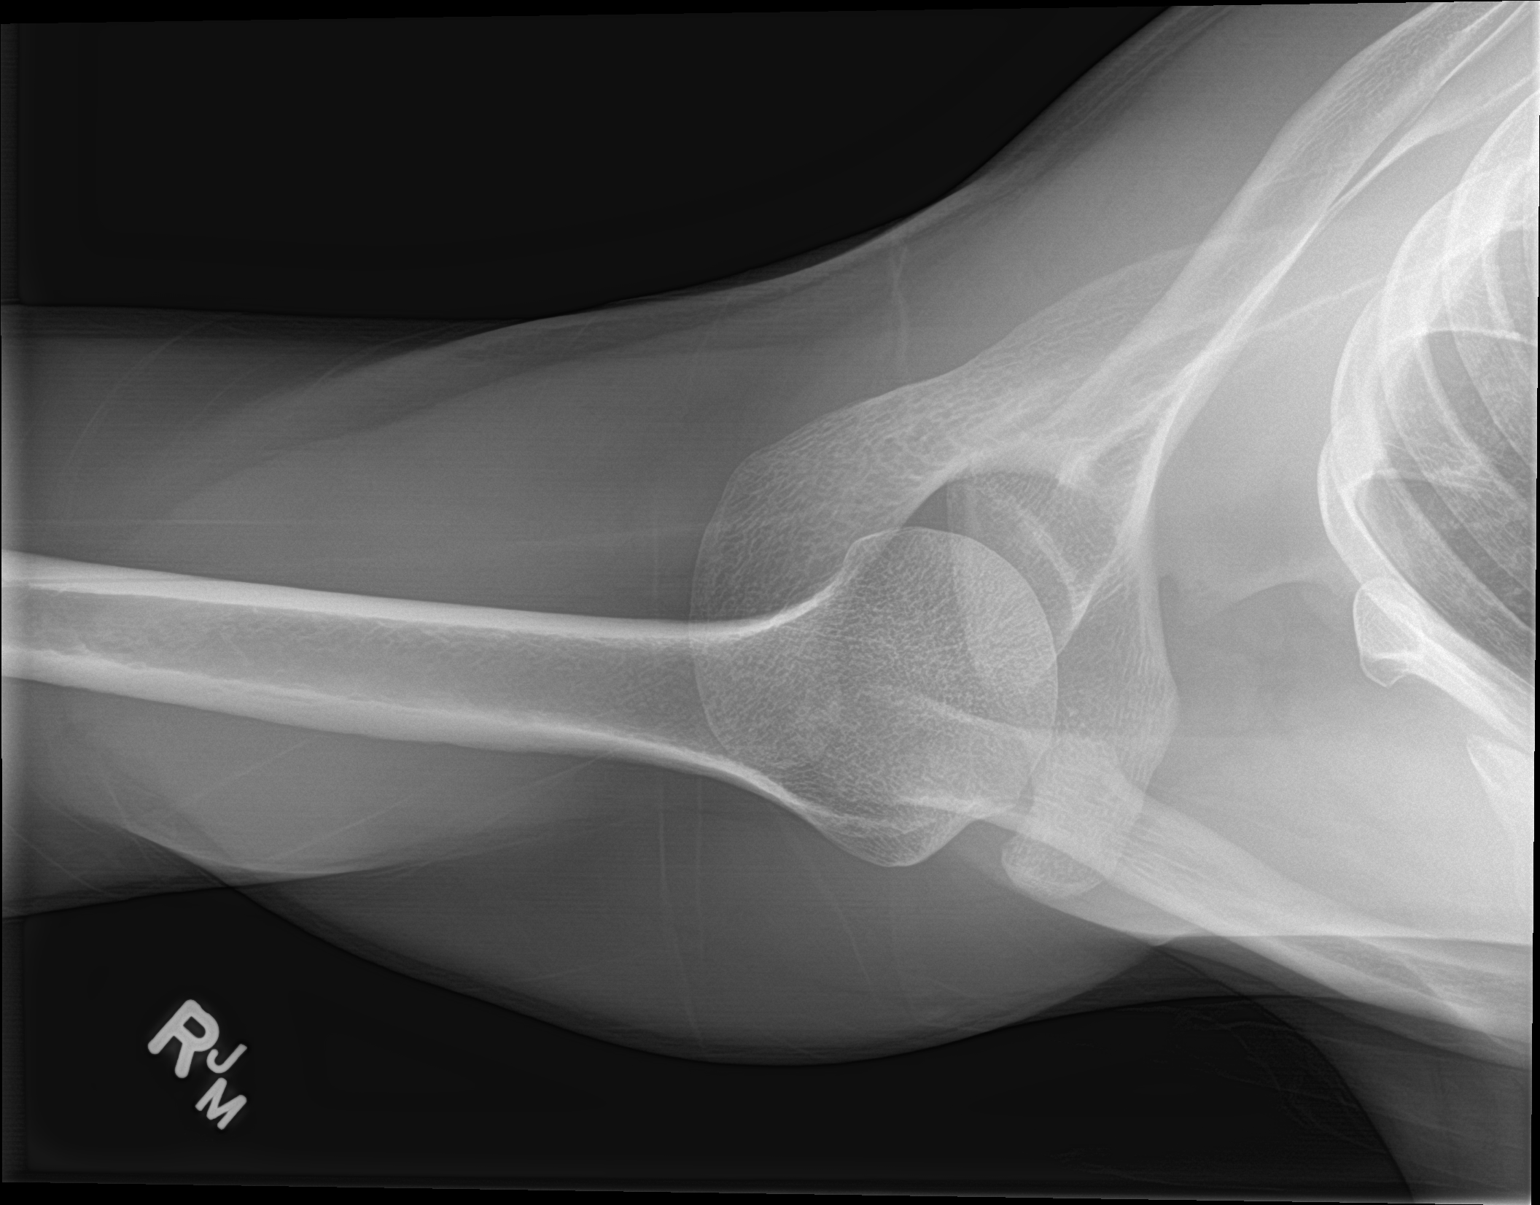

[3 of 3 positions shown; findings below may reference images not displayed]

FINDINGS: There is no evidence of fracture or dislocation. There is no
evidence of arthropathy or other focal bone abnormality. Soft
tissues are unremarkable.
IMPRESSION: Negative.

## 2019-12-30 ENCOUNTER — Other Ambulatory Visit: Payer: Self-pay | Admitting: Medical

## 2019-12-30 NOTE — Telephone Encounter (Addendum)
Requesting:concerta  Contract:yes UDS:n/a Last OV:11/28/19 Next OV:n/a Last Refill:11/28/19 #30-0rf Database: up to date. Reviewed today 01/01/20.   Please advise   He is not up to date on uds. He needs to get scheduled for that then will refill med.

## 2020-01-02 ENCOUNTER — Other Ambulatory Visit: Payer: Self-pay

## 2020-01-02 DIAGNOSIS — Z79899 Other long term (current) drug therapy: Secondary | ICD-10-CM

## 2020-01-02 DIAGNOSIS — F988 Other specified behavioral and emotional disorders with onset usually occurring in childhood and adolescence: Secondary | ICD-10-CM

## 2020-01-02 MED ORDER — METHYLPHENIDATE HCL ER (OSM) 27 MG PO TBCR: 27.0000 mg | EXTENDED_RELEASE_TABLET | ORAL | 0 refills | Status: DC

## 2020-01-02 NOTE — Telephone Encounter (Addendum)
Appointment scheduled for 3/10 for the UDS  Sent in rx concerta since scheduled.

## 2020-01-03 ENCOUNTER — Other Ambulatory Visit: Payer: Self-pay

## 2020-01-03 ENCOUNTER — Other Ambulatory Visit: Payer: Medicaid Other

## 2020-01-03 DIAGNOSIS — Z79899 Other long term (current) drug therapy: Secondary | ICD-10-CM

## 2020-01-04 ENCOUNTER — Other Ambulatory Visit: Payer: Medicaid Other

## 2020-01-05 LAB — PAIN MGMT, PROFILE 8 W/CONF, U
6 Acetylmorphine: NEGATIVE ng/mL
Alcohol Metabolites: NEGATIVE ng/mL (ref <500)
Amphetamine: 1872 ng/mL
Amphetamines: POSITIVE ng/mL
Benzodiazepines: NEGATIVE ng/mL
Buprenorphine, Urine: NEGATIVE ng/mL
Cocaine Metabolite: NEGATIVE ng/mL
Creatinine: 196 mg/dL
MDMA: NEGATIVE ng/mL
Marijuana Metabolite: NEGATIVE ng/mL
Methamphetamine: NEGATIVE ng/mL
Opiates: NEGATIVE ng/mL
Oxidant: NEGATIVE ug/mL
Oxycodone: NEGATIVE ng/mL
pH: 6.8 (ref 4.5–9.0)

## 2020-01-30 ENCOUNTER — Other Ambulatory Visit: Payer: Self-pay | Admitting: Medical

## 2020-01-31 MED ORDER — METHYLPHENIDATE HCL ER (OSM) 27 MG PO TBCR: 27.0000 mg | EXTENDED_RELEASE_TABLET | ORAL | 0 refills | Status: DC

## 2020-01-31 NOTE — Telephone Encounter (Signed)
rx concerta sent to pt pharmacy.

## 2020-03-06 ENCOUNTER — Other Ambulatory Visit: Payer: Self-pay | Admitting: Medical

## 2020-03-07 MED ORDER — METHYLPHENIDATE HCL ER (OSM) 27 MG PO TBCR: 27.0000 mg | EXTENDED_RELEASE_TABLET | ORAL | 0 refills | Status: DC

## 2020-03-07 NOTE — Telephone Encounter (Signed)
Rx concerta sent to pt pharmacy. He is up to date on uds. Is his contract signed/up to date?

## 2020-04-09 ENCOUNTER — Other Ambulatory Visit: Payer: Self-pay | Admitting: Medical

## 2020-04-09 MED ORDER — METHYLPHENIDATE HCL ER (OSM) 27 MG PO TBCR: 27.0000 mg | EXTENDED_RELEASE_TABLET | ORAL | 0 refills | Status: DC

## 2020-04-09 NOTE — Telephone Encounter (Signed)
Pt has ADD. Doing well on concerta.  He is up to date on uds and contract. Controlled med site reviewd as well. Refilling his med today.

## 2020-04-10 NOTE — Progress Notes (Signed)
Reviewed and agree Ann Held, DO

## 2020-05-09 ENCOUNTER — Other Ambulatory Visit: Payer: Self-pay | Admitting: Medical

## 2020-05-11 ENCOUNTER — Telehealth: Payer: Self-pay | Admitting: Medical

## 2020-05-11 MED ORDER — METHYLPHENIDATE HCL ER (OSM) 27 MG PO TBCR: 27.0000 mg | EXTENDED_RELEASE_TABLET | ORAL | 0 refills | Status: DC

## 2020-05-11 NOTE — Telephone Encounter (Signed)
Refilled pt concerta. He needs controlled med visit in august before any further refills.

## 2020-06-11 ENCOUNTER — Other Ambulatory Visit: Payer: Self-pay | Admitting: Medical

## 2020-06-12 NOTE — Telephone Encounter (Signed)
Pt needs controlled med visit before any further refills give for concerta. Last visit was in February. So over due by one month.  Please get him scheduled this week or on monday

## 2020-06-15 ENCOUNTER — Other Ambulatory Visit: Payer: Self-pay

## 2020-06-15 ENCOUNTER — Ambulatory Visit (INDEPENDENT_AMBULATORY_CARE_PROVIDER_SITE_OTHER): Payer: Medicaid Other | Admitting: Medical

## 2020-06-15 VITALS — BP 120/85 | HR 101 | Resp 18 | Ht 72.0 in | Wt 163.0 lb

## 2020-06-15 DIAGNOSIS — Z79899 Other long term (current) drug therapy: Secondary | ICD-10-CM | POA: Diagnosis not present

## 2020-06-15 DIAGNOSIS — R41840 Attention and concentration deficit: Secondary | ICD-10-CM

## 2020-06-15 MED ORDER — METHYLPHENIDATE HCL ER (OSM) 27 MG PO TBCR: 27.0000 mg | EXTENDED_RELEASE_TABLET | ORAL | 0 refills | Status: DC

## 2020-06-15 NOTE — Patient Instructions (Addendum)
You appear to still be doing well with concerta but you expressed some desire to try other non stimulant option. Discussed particularly wellbutrin. I have seen  Psychiatrist/specialist use in past and have prescribed for ADD as well and have seen some positive results.  Would recommend continue concerta presently. uds and contract updated.  Considering stopping concerta and starting wellbutrin some time in future when you have 7 day vacation and less at stake if were not to work as hoped.  Skipper Cliche also option but can take weeks to feel full effect.  Follow up 3 weeks or as needed

## 2020-06-15 NOTE — Progress Notes (Signed)
Subjective:    Patient ID: Corey Sosa, male    DOB: 09-14-1976, 44 y.o.   MRN: [id number]  HPI   Pt has history of ADD. He is on concerta. A year and half ago he found concerta very helpful with  his concentration. His current job is less demanding and he considers not taking concerta. However he admits on days that he forgets to take he is very unorganized, forgetful and inattentive.   Pt states medication does effect his appetite. Has always been thin and difficulty gaining weight.  Pt has considered trying non stimulant option.     Review of Systems  Constitutional: Negative for chills, fatigue and fever.  Respiratory: Negative for cough, chest tightness, shortness of breath and wheezing.   Cardiovascular: Negative for chest pain and palpitations.  Gastrointestinal: Negative for abdominal pain, constipation, diarrhea, nausea and rectal pain.  Musculoskeletal: Negative for back pain.  Skin: Negative for rash.  Psychiatric/Behavioral: Positive for decreased concentration. Negative for behavioral problems, confusion and sleep disturbance. The patient is not nervous/anxious.     Past Medical History:  Diagnosis Date  . Shoulder pain      Social History   Socioeconomic History  . Marital status: Married    Spouse name: Not on file  . Number of children: Not on file  . Years of education: Not on file  . Highest education level: Not on file  Occupational History  . Not on file  Tobacco Use  . Smoking status: Never Smoker  . Smokeless tobacco: Never Used  Substance and Sexual Activity  . Alcohol use: Yes    Comment: 1 beer twice a week.  . Drug use: No  . Sexual activity: Yes  Other Topics Concern  . Not on file  Social History Narrative  . Not on file   Social Determinants of Health   Financial Resource Strain:   . Difficulty of Paying Living Expenses: Not on file  Food Insecurity:   . Worried About Charity fundraiser in the Last Year: Not on file    . Ran Out of Food in the Last Year: Not on file  Transportation Needs:   . Lack of Transportation (Medical): Not on file  . Lack of Transportation (Non-Medical): Not on file  Physical Activity:   . Days of Exercise per Week: Not on file  . Minutes of Exercise per Session: Not on file  Stress:   . Feeling of Stress : Not on file  Social Connections:   . Frequency of Communication with Friends and Family: Not on file  . Frequency of Social Gatherings with Friends and Family: Not on file  . Attends Religious Services: Not on file  . Active Member of Clubs or Organizations: Not on file  . Attends Archivist Meetings: Not on file  . Marital Status: Not on file  Intimate Partner Violence:   . Fear of Current or Ex-Partner: Not on file  . Emotionally Abused: Not on file  . Physically Abused: Not on file  . Sexually Abused: Not on file    No past surgical history on file.  Family History  Problem Relation Age of Onset  . Cancer Mother   . Stroke Father   . Heart attack Father     No Known Allergies  Current Outpatient Medications on File Prior to Visit  Medication Sig Dispense Refill  . methylphenidate (CONCERTA) 27 MG PO CR tablet Take 1 tablet (27 mg total) by mouth  every morning. Can give brand. 30 tablet 0   No current facility-administered medications on file prior to visit.    BP 120/85   Pulse (!) 101   Resp 18   Ht 6' (1.829 m)   Wt 163 lb (73.9 kg)   SpO2 99%   BMI 22.11 kg/m       Objective:   Physical Exam  General Mental Status- Alert. General Appearance- Not in acute distress.   Skin General: Color- Normal Color. Moisture- Normal Moisture.  Neck Carotid Arteries- Normal color. Moisture- Normal Moisture. No carotid bruits. No JVD.  Chest and Lung Exam Auscultation: Breath Sounds:-Normal.  Cardiovascular Auscultation:Rythm- Regular. Murmurs & Other Heart Sounds:Auscultation of the heart reveals- No  Murmurs.  Abdomen Inspection:-Inspeection Normal. Palpation/Percussion:Note:No mass. Palpation and Percussion of the abdomen reveal- Non Tender, Non Distended + BS, no rebound or guarding.    Neurologic Cranial Nerve exam:- CN Sosa-XII intact(No nystagmus), symmetric smile. Strength:- 5/5 equal and symmetric strength both upper and lower extremities.      Assessment & Plan:  You appear to still be doing well with concerta but you expressed some desire to try other non stimulant option. Discussed particularly wellbutrin. I have seen  Psychiatrist/specialist use in past and have prescribed for ADD as well and have seen some positive results.  Would recommend continue concerta presently. uds and contract updated.  Considering stopping concerta and starting wellbutrin some time in future when you have 7 day vacation and less at stake if were not to work as hoped.  Follow up 3 weeks or as needed   Time spent with patient today was 35  minutes which consisted of chart review, discussing diagnosis, work up treatment and documentation.  Mackie Pai, PA-C

## 2020-06-19 LAB — DRUG MONITORING, PANEL 8 WITH CONFIRMATION, URINE
6 Acetylmorphine: NEGATIVE ng/mL (ref <10)
Alcohol Metabolites: NEGATIVE ng/mL
Amphetamines: NEGATIVE ng/mL (ref <500)
Benzodiazepines: NEGATIVE ng/mL (ref <100)
Buprenorphine, Urine: NEGATIVE ng/mL (ref <5)
Cocaine Metabolite: NEGATIVE ng/mL (ref <150)
Creatinine: 142.2 mg/dL
MDMA: NEGATIVE ng/mL (ref <500)
Marijuana Metabolite: NEGATIVE ng/mL (ref <20)
Opiates: NEGATIVE ng/mL (ref <100)
Oxidant: NEGATIVE ug/mL
Oxycodone: NEGATIVE ng/mL (ref <100)
pH: 5.2 (ref 4.5–9.0)

## 2020-06-19 LAB — DM TEMPLATE

## 2020-07-16 ENCOUNTER — Other Ambulatory Visit: Payer: Self-pay | Admitting: Medical

## 2020-07-16 NOTE — Telephone Encounter (Signed)
Last written: 06/15/20 Last ov: 06/15/20 Next ov: none Contract:  UDS: 06/15/20

## 2020-07-18 MED ORDER — METHYLPHENIDATE HCL ER (OSM) 27 MG PO TBCR: 27.0000 mg | EXTENDED_RELEASE_TABLET | ORAL | 0 refills | Status: DC

## 2020-07-18 NOTE — Telephone Encounter (Signed)
Rx concerta refilled.

## 2020-08-14 ENCOUNTER — Other Ambulatory Visit: Payer: Self-pay | Admitting: Medical

## 2020-08-15 NOTE — Telephone Encounter (Addendum)
Last written: 07/18/20 Last ov: 06/15/20 Next ov: none Contract: 06/15/20 UDS:07/27/19

## 2020-08-16 MED ORDER — METHYLPHENIDATE HCL ER (OSM) 27 MG PO TBCR: 27.0000 mg | EXTENDED_RELEASE_TABLET | ORAL | 0 refills | Status: DC

## 2020-08-16 NOTE — Telephone Encounter (Signed)
Refilled med concerta. Follow up in December if he wants to continue.  Follow up sooner if he wants to stop. He had talked about trying to discontinue and switch to wellbutrin or stratterra for ADHD.

## 2020-08-17 NOTE — Telephone Encounter (Signed)
Called pt and lvm to return call to schedule appt.

## 2020-09-18 ENCOUNTER — Other Ambulatory Visit: Payer: Self-pay | Admitting: Medical

## 2020-09-18 MED ORDER — METHYLPHENIDATE HCL ER (OSM) 27 MG PO TBCR: 27.0000 mg | EXTENDED_RELEASE_TABLET | ORAL | 0 refills | Status: DC

## 2020-09-18 NOTE — Telephone Encounter (Signed)
Last written: 08/16/20   Last ov: 06/15/20 Next ov: 10/08/20 Contract: 07/27/19 UDS: 06/15/20

## 2020-09-18 NOTE — Telephone Encounter (Signed)
Rx concerta refill sent to pt pharmacy.

## 2020-10-08 ENCOUNTER — Other Ambulatory Visit: Payer: Self-pay

## 2020-10-08 ENCOUNTER — Ambulatory Visit: Payer: Medicaid Other | Admitting: Medical

## 2020-10-08 VITALS — BP 115/89 | HR 98 | Resp 18 | Ht 72.0 in | Wt 165.2 lb

## 2020-10-08 DIAGNOSIS — E739 Lactose intolerance, unspecified: Secondary | ICD-10-CM | POA: Diagnosis not present

## 2020-10-08 DIAGNOSIS — Z79899 Other long term (current) drug therapy: Secondary | ICD-10-CM | POA: Diagnosis not present

## 2020-10-08 DIAGNOSIS — R41840 Attention and concentration deficit: Secondary | ICD-10-CM

## 2020-10-08 NOTE — Progress Notes (Signed)
Subjective:    Patient ID: Corey Sosa, male    DOB: 1976-10-13, 44 y.o.   MRN: [id number]  HPI  Pt in for follow up.  Pt has history of ADD. He is on concerta. A year and half ago he found concerta very helpful with  his concentration. Pt states he might start new job. He thinks it won't be to demanding on concentration but not sure.  However he admits on days that he forgets to take he is very unorganized, forgetful and inattentive.   Pt states medication does effect his appetite. Has always been thin and difficulty gaining weight.      Review of Systems  Constitutional: Negative for chills, fatigue and fever.  Respiratory: Negative for cough, chest tightness, shortness of breath and wheezing.   Cardiovascular: Negative for chest pain and palpitations.  Gastrointestinal: Negative for abdominal pain.  Genitourinary: Negative for dysuria, flank pain and frequency.  Musculoskeletal: Negative for back pain.  Skin: Negative for rash and wound.  Neurological: Negative for dizziness, seizures, weakness, numbness and headaches.  Hematological: Negative for adenopathy. Does not bruise/bleed easily.  Psychiatric/Behavioral: Positive for decreased concentration. Negative for behavioral problems, confusion, sleep disturbance and suicidal ideas. The patient is not nervous/anxious.     Past Medical History:  Diagnosis Date  . Shoulder pain      Social History   Socioeconomic History  . Marital status: Married    Spouse name: Not on file  . Number of children: Not on file  . Years of education: Not on file  . Highest education level: Not on file  Occupational History  . Not on file  Tobacco Use  . Smoking status: Never Smoker  . Smokeless tobacco: Never Used  Substance and Sexual Activity  . Alcohol use: Yes    Comment: 1 beer twice a week.  . Drug use: No  . Sexual activity: Yes  Other Topics Concern  . Not on file  Social History Narrative  . Not on file    Social Determinants of Health   Financial Resource Strain: Not on file  Food Insecurity: Not on file  Transportation Needs: Not on file  Physical Activity: Not on file  Stress: Not on file  Social Connections: Not on file  Intimate Partner Violence: Not on file    No past surgical history on file.  Family History  Problem Relation Age of Onset  . Cancer Mother   . Stroke Father   . Heart attack Father     No Known Allergies  Current Outpatient Medications on File Prior to Visit  Medication Sig Dispense Refill  . methylphenidate (CONCERTA) 27 MG PO CR tablet Take 1 tablet (27 mg total) by mouth every morning. Can give brand. 30 tablet 0   No current facility-administered medications on file prior to visit.    BP 115/89   Pulse 98   Resp 18   Ht 6' (1.829 m)   Wt 165 lb 3.2 oz (74.9 kg)   SpO2 99%   BMI 22.41 kg/m       Objective:   Physical Exam  General Mental Status- Alert. General Appearance- Not in acute distress.   Skin General: Color- Normal Color. Moisture- Normal Moisture.  Neck Carotid Arteries- Normal color. Moisture- Normal Moisture. No carotid bruits. No JVD.  Chest and Lung Exam Auscultation: Breath Sounds:-Normal.  Cardiovascular Auscultation:Rythm- Regular. Murmurs & Other Heart Sounds:Auscultation of the heart reveals- No Murmurs.  Abdomen Inspection:-Inspeection Normal. Palpation/Percussion:Note:No mass. Palpation  and Percussion of the abdomen reveal- Non Tender, Non Distended + BS, no rebound or guarding.   Neurologic Cranial Nerve exam:- CN Sosa-XII intact(No nystagmus), symmetric smile. Strength:- 5/5 equal and symmetric strength both upper and lower extremities.      Assessment & Plan:  ADD is well controlled with concerta. Up to date on UDS and on contract.   Your concerta due for refill in about one week. Send me my chart message when due and will refill.  Do ask you get scheduled for cpe/wellness exam early next  year. Want you to get fasting labs that day so scheule early in morning 8-9 am.

## 2020-10-08 NOTE — Patient Instructions (Addendum)
ADD is well controlled with concerta. Up to date on UDS and on contract.   Your concerta due for refill in about one week. Send me my chart message when due and will refill.  Do ask you get scheduled for cpe/wellness exam early next year. Want you to get fasting labs that day so scheule early in morning 8-9 am.  On discussion it does sound like lactose intolerance. Can continue lactaid.  Follow up 3-4 months or as needed

## 2020-10-16 ENCOUNTER — Other Ambulatory Visit: Payer: Self-pay | Admitting: Medical

## 2020-10-16 MED ORDER — METHYLPHENIDATE HCL ER (OSM) 27 MG PO TBCR: 27.0000 mg | EXTENDED_RELEASE_TABLET | ORAL | 0 refills | Status: DC

## 2020-10-16 NOTE — Telephone Encounter (Signed)
Rx concernta sent to pt pharmacy.

## 2020-10-16 NOTE — Telephone Encounter (Signed)
Requesting: concerta Contract: [id number] UDS:05/2020 Last Visit:09/2020 Next Visit:n/a Last Refill: 08/2020  Please Advise

## 2020-11-19 ENCOUNTER — Other Ambulatory Visit: Payer: Self-pay | Admitting: Medical

## 2020-11-19 MED ORDER — METHYLPHENIDATE HCL ER (OSM) 27 MG PO TBCR: 27.0000 mg | EXTENDED_RELEASE_TABLET | ORAL | 0 refills | Status: DC

## 2020-11-19 NOTE — Telephone Encounter (Signed)
Requesting: Concerta 27mg  CR tablet  Contract: 06/15/2020 UDS: 06/15/2020 Last Visit: 10/08/2020 Next Visit: None Last Refill: 10/16/2020 #30 and 0RF  Please Advise

## 2020-11-19 NOTE — Telephone Encounter (Signed)
Refill of concerta sent to pt pharmacy.

## 2020-12-20 ENCOUNTER — Other Ambulatory Visit: Payer: Self-pay | Admitting: Medical

## 2020-12-20 NOTE — Telephone Encounter (Signed)
Requesting: Concerta 27mg  Contract: 06/15/2020 UDS: 06/15/2020 Last Visit: 10/08/2020 Next Visit: None Last Refill: 11/19/2020 #30 and 0RF  Pt is requesting generic only  Please Advise

## 2020-12-21 DIAGNOSIS — M65842 Other synovitis and tenosynovitis, left hand: Secondary | ICD-10-CM | POA: Diagnosis not present

## 2020-12-21 MED ORDER — METHYLPHENIDATE HCL ER (OSM) 27 MG PO TBCR: 27.0000 mg | EXTENDED_RELEASE_TABLET | ORAL | 0 refills | Status: DC

## 2021-01-28 ENCOUNTER — Other Ambulatory Visit: Payer: Self-pay | Admitting: Medical

## 2021-01-28 MED ORDER — METHYLPHENIDATE HCL ER (OSM) 27 MG PO TBCR: 27.0000 mg | EXTENDED_RELEASE_TABLET | ORAL | 0 refills | Status: DC

## 2021-01-28 NOTE — Telephone Encounter (Signed)
Refilled pt concenta. Please get him schedule for office bisit  by the end of this month for controlled med visit.

## 2021-01-28 NOTE — Telephone Encounter (Signed)
Requesting: Concerta 27mg  Contract: 06/15/2020 UDS: 06/15/2020 Last Visit: 10/08/2020 Next Visit: None Last Refill: 12/21/2020 #30 and 0RF  Please Advise

## 2021-03-11 ENCOUNTER — Other Ambulatory Visit: Payer: Self-pay | Admitting: Medical

## 2021-03-11 NOTE — Telephone Encounter (Signed)
Requesting: Concerta 27mg  CR tablet Contract: 06/15/2020 UDS: 06/15/2020 Last Visit: 10/08/2020 Next Visit: None Last Refill: 01/28/2021 #30 and 0RF  Please Advise

## 2021-03-11 NOTE — Telephone Encounter (Signed)
More than 5 months since last visit. Please get him scheduled for controlled med visit. Denied the prescription until he can come in. Can we get him in this week or early next week.

## 2021-03-12 NOTE — Telephone Encounter (Signed)
Pt called and appointment made

## 2021-03-13 ENCOUNTER — Ambulatory Visit: Payer: Medicaid Other | Admitting: Medical

## 2021-03-19 ENCOUNTER — Other Ambulatory Visit: Payer: Self-pay

## 2021-03-19 ENCOUNTER — Ambulatory Visit (INDEPENDENT_AMBULATORY_CARE_PROVIDER_SITE_OTHER): Payer: BC Managed Care – PPO | Admitting: Medical

## 2021-03-19 VITALS — BP 115/78 | HR 78 | Resp 18 | Ht 73.0 in | Wt 161.4 lb

## 2021-03-19 DIAGNOSIS — Z79899 Other long term (current) drug therapy: Secondary | ICD-10-CM | POA: Diagnosis not present

## 2021-03-19 DIAGNOSIS — Z0000 Encounter for general adult medical examination without abnormal findings: Secondary | ICD-10-CM

## 2021-03-19 DIAGNOSIS — F988 Other specified behavioral and emotional disorders with onset usually occurring in childhood and adolescence: Secondary | ICD-10-CM | POA: Diagnosis not present

## 2021-03-19 MED ORDER — METHYLPHENIDATE HCL ER (OSM) 27 MG PO TBCR: 27.0000 mg | EXTENDED_RELEASE_TABLET | ORAL | 0 refills | Status: DC

## 2021-03-19 NOTE — Progress Notes (Signed)
Subjective:    Patient ID: Corey Sosa, male    DOB: 09/20/1976, 45 y.o.   MRN: [id number]  HPI   Pt in for ADD visit but on review no wellness exam. Decided to do wellness exam today as well.  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 Pt is eating healthy. Avoids fried foods. 1 caffeine beverage a day. Rare alcohol. Non smoker.  Pt has history of ADD. He is on concerta. A year and half ago he found concerta very helpfulwithhis concentration. Pt states can concentrate with his new job. Also used check list which helps.  However he admits on days that he forgets to take he is very unorganized, forgetful and inattentive.   Pt states medication does effect his appetite. Pt wt overall stable compared to prior.     Review of Systems  Constitutional: Negative for chills, fatigue and fever.  HENT: Negative for congestion and drooling.   Respiratory: Negative for chest tightness, shortness of breath and wheezing.   Cardiovascular: Negative for chest pain and palpitations.  Gastrointestinal: Negative for abdominal pain, constipation, diarrhea and nausea.  Genitourinary: Negative for dysuria and enuresis.  Skin: Negative for rash.  Neurological: Negative for dizziness, light-headedness and headaches.  Hematological: Negative for adenopathy. Does not bruise/bleed easily.  Psychiatric/Behavioral: Positive for decreased concentration. Negative for agitation, dysphoric mood, self-injury and suicidal ideas. The patient is not nervous/anxious and is not hyperactive.    Past Medical History:  Diagnosis Date  . Shoulder pain      Social History   Socioeconomic History  . Marital status: Married    Spouse name: Not on file  . Number of children: Not on file  . Years of education: Not on file  . Highest education level: Not on file  Occupational History  . Not on file  Tobacco Use  . Smoking status: Never Smoker  . Smokeless  tobacco: Never Used  Substance and Sexual Activity  . Alcohol use: Yes    Comment: 1 beer twice a week.  . Drug use: No  . Sexual activity: Yes  Other Topics Concern  . Not on file  Social History Narrative  . Not on file   Social Determinants of Health   Financial Resource Strain: Not on file  Food Insecurity: Not on file  Transportation Needs: Not on file  Physical Activity: Not on file  Stress: Not on file  Social Connections: Not on file  Intimate Partner Violence: Not on file    No past surgical history on file.  Family History  Problem Relation Age of Onset  . Cancer Mother   . Stroke Father   . Heart attack Father     No Known Allergies  Current Outpatient Medications on File Prior to Visit  Medication Sig Dispense Refill  . methylphenidate (CONCERTA) 27 MG PO CR tablet Take 1 tablet (27 mg total) by mouth every morning. Can give brand. 30 tablet 0   No current facility-administered medications on file prior to visit.    BP 115/78   Pulse 78   Resp 18   Ht 6\' 1"  (1.854 m)   Wt 161 lb 6.4 oz (73.2 kg)   SpO2 100%   BMI 21.29 kg/m       Objective:   Physical Exam  General Mental Status- Alert. General Appearance- Not in acute distress.   Skin General: Color- Normal Color. Moisture- Normal Moisture.  Neck Carotid Arteries- Normal  color. Moisture- Normal Moisture. No carotid bruits. No JVD.  Chest and Lung Exam Auscultation: Breath Sounds:-Normal.  Cardiovascular Auscultation:Rythm- Regular. Murmurs & Other Heart Sounds:Auscultation of the heart reveals- No Murmurs.  Abdomen Inspection:-Inspeection Normal. Palpation/Percussion:Note:No mass. Palpation and Percussion of the abdomen reveal- Non Tender, Non Distended + BS, no rebound or guarding.   Neurologic Cranial Nerve exam:- CN Sosa-XII intact(No nystagmus), symmetric smile. Strength:- 5/5 equal and symmetric strength both upper and lower extremities.      Assessment & Plan:  For  you wellness exam today I have ordered cbc, cmp and  lipid panel.  Vaccine up to date.  Recommend exercise and healthy diet.  We will let you know lab results as they come in.  Follow up date appointment will be determined after lab review.   ADD well controlled on vyvanse. Update uds and contract today.  Follow up 4 months.   Mackie Pai, [place]   [id number] charge today in as addressed ADD. Rx concerta.

## 2021-03-19 NOTE — Patient Instructions (Addendum)
For you wellness exam today I have ordered cbc, cmp and  lipid panel.  Vaccine up to date.  Recommend exercise and healthy diet.  We will let you know lab results as they come in.  Follow up date appointment will be determined after lab review.   ADD well controlled on concerta. Update uds and contract today.  Follow up 4 months or as needed.

## 2021-03-20 ENCOUNTER — Encounter: Payer: Self-pay | Admitting: Medical

## 2021-03-20 LAB — COMPREHENSIVE METABOLIC PANEL
ALT: 13 U/L (ref 0–53)
AST: 14 U/L (ref 0–37)
Albumin: 4.7 g/dL (ref 3.5–5.2)
Alkaline Phosphatase: 85 U/L (ref 39–117)
BUN: 23 mg/dL (ref 6–23)
CO2: 29 mEq/L (ref 19–32)
Calcium: 9.2 mg/dL (ref 8.4–10.5)
Chloride: 105 mEq/L (ref 96–112)
Creatinine, Ser: 1.18 mg/dL (ref 0.40–1.50)
GFR: 75.73 mL/min (ref >60.00)
Glucose, Bld: 69 mg/dL — ABNORMAL LOW (ref 70–99)
Potassium: 4 mEq/L (ref 3.5–5.1)
Sodium: 141 mEq/L (ref 135–145)
Total Bilirubin: 1.3 mg/dL — ABNORMAL HIGH (ref 0.2–1.2)
Total Protein: 7.2 g/dL (ref 6.0–8.3)

## 2021-03-20 LAB — LIPID PANEL
Cholesterol: 167 mg/dL (ref 0–200)
HDL: 35.7 mg/dL — ABNORMAL LOW (ref >39.00)
NonHDL: 131.21
Total CHOL/HDL Ratio: 5
Triglycerides: 240 mg/dL — ABNORMAL HIGH (ref 0.0–149.0)
VLDL: 48 mg/dL — ABNORMAL HIGH (ref 0.0–40.0)

## 2021-03-20 LAB — CBC WITH DIFFERENTIAL/PLATELET
Basophils Absolute: 0 10*3/uL (ref 0.0–0.1)
Basophils Relative: 0.8 % (ref 0.0–3.0)
Eosinophils Absolute: 0.1 10*3/uL (ref 0.0–0.7)
Eosinophils Relative: 0.9 % (ref 0.0–5.0)
HCT: 43.2 % (ref 39.0–52.0)
Hemoglobin: 14.9 g/dL (ref 13.0–17.0)
Lymphocytes Relative: 19 % (ref 12.0–46.0)
Lymphs Abs: 1.1 10*3/uL (ref 0.7–4.0)
MCHC: 34.4 g/dL (ref 30.0–36.0)
MCV: 88.8 fl (ref 78.0–100.0)
Monocytes Absolute: 0.3 10*3/uL (ref 0.1–1.0)
Monocytes Relative: 6 % (ref 3.0–12.0)
Neutro Abs: 4.1 10*3/uL (ref 1.4–7.7)
Neutrophils Relative %: 73.3 % (ref 43.0–77.0)
Platelets: 245 10*3/uL (ref 150.0–400.0)
RBC: 4.87 Mil/uL (ref 4.22–5.81)
RDW: 13.3 % (ref 11.5–15.5)
WBC: 5.6 10*3/uL (ref 4.0–10.5)

## 2021-03-20 LAB — LDL CHOLESTEROL, DIRECT: Direct LDL: 115 mg/dL

## 2021-03-22 LAB — DRUG MONITORING, PANEL 8 WITH CONFIRMATION, URINE
6 Acetylmorphine: NEGATIVE ng/mL (ref <10)
Alcohol Metabolites: NEGATIVE ng/mL
Amphetamine: 5212 ng/mL — ABNORMAL HIGH (ref <250)
Amphetamines: POSITIVE ng/mL — AB (ref <500)
Benzodiazepines: NEGATIVE ng/mL (ref <100)
Buprenorphine, Urine: NEGATIVE ng/mL (ref <5)
Cocaine Metabolite: NEGATIVE ng/mL (ref <150)
Creatinine: 177.3 mg/dL
MDMA: NEGATIVE ng/mL (ref <500)
Marijuana Metabolite: NEGATIVE ng/mL (ref <20)
Methamphetamine: NEGATIVE ng/mL (ref <250)
Opiates: NEGATIVE ng/mL (ref <100)
Oxidant: NEGATIVE ug/mL
Oxycodone: NEGATIVE ng/mL (ref <100)
pH: 6.9 (ref 4.5–9.0)

## 2021-03-22 LAB — DM TEMPLATE

## 2021-03-29 ENCOUNTER — Encounter: Payer: Self-pay | Admitting: Medical

## 2021-04-03 ENCOUNTER — Ambulatory Visit: Payer: BC Managed Care – PPO | Admitting: Medical

## 2021-04-03 ENCOUNTER — Other Ambulatory Visit: Payer: Self-pay

## 2021-04-03 VITALS — BP 119/87 | HR 73 | Resp 18 | Ht 73.0 in | Wt 166.0 lb

## 2021-04-03 DIAGNOSIS — D229 Melanocytic nevi, unspecified: Secondary | ICD-10-CM

## 2021-04-03 DIAGNOSIS — K921 Melena: Secondary | ICD-10-CM

## 2021-04-03 DIAGNOSIS — L989 Disorder of the skin and subcutaneous tissue, unspecified: Secondary | ICD-10-CM | POA: Diagnosis not present

## 2021-04-03 NOTE — Patient Instructions (Signed)
For numerous and varying  sized moles and skin lesions decided to go ahead and refer to dermatologist.  For history of intermittent episodes of bright red blood in stools/wiping decided to go ahead and refer you to gastroenterologist.  Your symptoms might represent internal hemorrhoids.  Since you have concern and questions about colonoscopy would defer to GI MD.  Discussed criteria for getting polyps and presently criteria not met.  History of ADD controlled on Concerta.  Recent increase of cost with brand medication.  Notify me 3 days before you run out of current prescription and would prescribe to see if generic available/cheaper.  Follow-up 3 to 4 months or as needed.

## 2021-04-03 NOTE — Progress Notes (Signed)
Subjective:    Patient ID: Corey Sosa, male    DOB: [birth year], 45 y.o.   MRN: [id number]  HPI  Pt has numerous moles/skin lesion front and back for years. Pt states some of moles have mild pain at times. One on abdomen at times he thinks will change shade of color. No hx of skin tab.  Pt also has questions on colonoscopy. No family history of polyps. No fh of colon cancer.   Pt wants to have colonoscopy. No black stools. He mentions remote one time small amount of bright red blood when wiped past Friday. And other time about 1 year ago. No rectal itching. No obvious hemorrhoids.  Pt has ADD. Pt is on concerta. Pt states recently cost went to $45(previoulsy was about $10). He wants generic on next refil.  Review of Systems  Constitutional: Negative for chills, fatigue and fever.  Respiratory: Negative for cough, chest tightness, shortness of breath, wheezing and stridor.   Cardiovascular: Negative for chest pain and palpitations.  Gastrointestinal: Positive for blood in stool. Negative for abdominal pain.       See hpi.  Musculoskeletal: Negative for back pain.  Skin:       See hpi.  Neurological: Negative for dizziness and headaches.  Hematological: Negative for adenopathy.  Psychiatric/Behavioral: Positive for decreased concentration.    Past Medical History:  Diagnosis Date  . Shoulder pain      Social History   Socioeconomic History  . Marital status: Married    Spouse name: Not on file  . Number of children: Not on file  . Years of education: Not on file  . Highest education level: Not on file  Occupational History  . Not on file  Tobacco Use  . Smoking status: Never Smoker  . Smokeless tobacco: Never Used  Substance and Sexual Activity  . Alcohol use: Yes    Comment: 1 beer twice a week.  . Drug use: No  . Sexual activity: Yes  Other Topics Concern  . Not on file  Social History Narrative  . Not on file   Social Determinants of Health    Financial Resource Strain: Not on file  Food Insecurity: Not on file  Transportation Needs: Not on file  Physical Activity: Not on file  Stress: Not on file  Social Connections: Not on file  Intimate Partner Violence: Not on file    No past surgical history on file.  Family History  Problem Relation Age of Onset  . Cancer Mother   . Stroke Father   . Heart attack Father     No Known Allergies  Current Outpatient Medications on File Prior to Visit  Medication Sig Dispense Refill  . methylphenidate (CONCERTA) 27 MG PO CR tablet Take 1 tablet (27 mg total) by mouth every morning. Can give brand. 30 tablet 0   No current facility-administered medications on file prior to visit.    BP 119/87   Pulse 73   Resp 18   Ht 6' 1"  (1.854 m)   Wt 166 lb (75.3 kg)   SpO2 98%   BMI 21.90 kg/m       Objective:   Physical Exam   General- No acute distress. Pleasant patient. Neck- Full range of motion, no jvd Lungs- Clear, even and unlabored. Heart- regular rate and rhythm. Neurologic- CNII- XII grossly intact. Rectal- no hemorrhoids. Sphincter tone normal. Stool card negative for blood.     Assessment & Plan:  For numerous and  varying  sized moles and skin lesions decided to go ahead and refer to dermatologist.  For history of intermittent episodes of bright red blood in stools/wiping decided to go ahead and refer you to gastroenterologist.  Your symptoms might represent internal hemorrhoids.  Since you have concern and questions about colonoscopy would defer to GI MD.  Discussed criteria for getting polyps and presently criteria not met.  History of ADD controlled on Concerta.  Recent increase of cost with brand medication.  Notify me 3 days before you run out of current prescription and would prescribe to see if generic available/cheaper.  Follow-up 3 to 4 months or as needed.  Time spent with patient today was  31 minutes which consisted of chart revdiew, discussing  diagnosis, work up treatment and documentation.

## 2021-04-05 ENCOUNTER — Encounter: Payer: Self-pay | Admitting: Medical

## 2021-04-24 ENCOUNTER — Other Ambulatory Visit: Payer: Self-pay | Admitting: Medical

## 2021-04-25 NOTE — Telephone Encounter (Addendum)
Requesting: concerta Contract: [id number] UDS: 03/19/21 Last Visit: 04/03/21 Next Visit: none Last Refill: 03/19/21  Please Advise  Refilling med today. Indication ADD. Doing well on med.  Mackie Pai, PA-C

## 2021-04-26 MED ORDER — METHYLPHENIDATE HCL ER (OSM) 27 MG PO TBCR: 27.0000 mg | EXTENDED_RELEASE_TABLET | ORAL | 0 refills | Status: DC

## 2021-05-17 ENCOUNTER — Telehealth: Payer: Self-pay

## 2021-05-17 MED ORDER — METHYLPHENIDATE HCL ER 35 MG PO CP24: ORAL_CAPSULE | ORAL | 0 refills | Status: DC

## 2021-05-17 NOTE — Telephone Encounter (Signed)
Concerta isnt covered by insurance , patient never picked up concerta, needs new medication . Patient is willing to try Metadate since it is covered.

## 2021-05-17 NOTE — Addendum Note (Signed)
Addended by: Anabel Halon on: 05/17/2021 04:01 PM   Modules accepted: Orders

## 2021-05-17 NOTE — Telephone Encounter (Signed)
Please send in Metadate CD/Methylphenidate CR, instead of Methylphenidate HCL or Concerta.

## 2021-05-17 NOTE — Telephone Encounter (Signed)
Pharmacy states patient needs a PA for Concerta , but the generic of Metadate is covered , spoke with patient and he states he is okay with switching medications . Patient hasnt picked up concerta script on [id number] .

## 2021-05-17 NOTE — Telephone Encounter (Signed)
Pt.notified

## 2021-05-18 MED ORDER — METHYLPHENIDATE HCL ER (CD) 30 MG PO CPCR: 30.0000 mg | ORAL_CAPSULE | ORAL | 0 refills | Status: DC

## 2021-05-18 NOTE — Addendum Note (Signed)
Addended by: Anabel Halon on: 05/18/2021 09:22 AM   Modules accepted: Orders

## 2021-05-18 NOTE — Addendum Note (Signed)
Addended by: Anabel Halon on: 05/18/2021 09:25 AM   Modules accepted: Orders

## 2021-05-22 ENCOUNTER — Telehealth: Payer: Self-pay

## 2021-05-22 NOTE — Telephone Encounter (Signed)
Opened in error

## 2021-05-22 NOTE — Telephone Encounter (Signed)
Tried to do PA for medication , covermymeds site stated " patient inactive" called [facility] and they stated patient needs to update insurance and they will run medication again once he comes in

## 2021-06-20 ENCOUNTER — Other Ambulatory Visit: Payer: Self-pay | Admitting: Medical

## 2021-06-20 MED ORDER — METHYLPHENIDATE HCL ER (CD) 30 MG PO CPCR: 30.0000 mg | ORAL_CAPSULE | ORAL | 0 refills | Status: DC

## 2021-06-20 NOTE — Telephone Encounter (Signed)
Will send rx to pharmacy. Have pt schedule follow up next month. Go ahead and get him scheduled.

## 2021-06-20 NOTE — Telephone Encounter (Signed)
Requesting: methylphenidate '30mg'$  Contract: 03/19/2021 UDS: 03/19/2021 Last Visit: 04/03/2021 Next Visit: None Last Refill: 05/18/2021 #30 and 0RF  Please Advise

## 2021-06-25 ENCOUNTER — Telehealth: Payer: Self-pay

## 2021-06-25 NOTE — Telephone Encounter (Signed)
Kate Sable Key: [id number] - PA Case ID: [id number] - Rx #: [id number]

## 2021-06-25 NOTE — Telephone Encounter (Signed)
Approvedtoday CaseId:[id number];Status:Approved;Review Type:Prior Auth;Coverage Start Date:05/26/2021;Coverage End Date:06/25/2022;

## 2021-07-31 ENCOUNTER — Other Ambulatory Visit: Payer: Self-pay | Admitting: Medical

## 2021-07-31 ENCOUNTER — Encounter: Payer: Self-pay | Admitting: Medical

## 2021-07-31 NOTE — Telephone Encounter (Addendum)
Requesting: Metadate CD 30mg   Contract: 03/19/2021 UDS: 03/19/2021 Last Visit: 04/03/2021 Next Visit: None Last Refill: 06/20/2021 #30 and 0RF  Please Advise   At the same time I got this refill request he sent me a MyChart message asking if he could switch to another medication.  For asking patient to schedule office visit.  See my MyChart message.  Thanks  [employer], PA-C

## 2021-08-05 ENCOUNTER — Telehealth: Payer: Self-pay | Admitting: Medical

## 2021-08-05 NOTE — Telephone Encounter (Signed)
Medication: methylphenidate (METADATE CD) 30 MG CR capsule  Has the patient contacted their pharmacy? Yes.   (If no, request that the patient contact the pharmacy for the refill.) (If yes, when and what did the pharmacy advise?)  Preferred Pharmacy (with phone number or street name):  [id number] [address] [place], [place] [address] [phone] is not actionable to desktop users since it is disabled  Agent: Please be advised that RX refills may take up to 3 business days. We ask that you follow-up with your pharmacy.

## 2021-08-05 NOTE — Telephone Encounter (Signed)
Requesting:methylphenidate  Contract:04/03/21 UDS:03/19/21 Last Visit:04/03/21 Next Visit:08/07/21 Last Refill:06/20/21  Please Advise

## 2021-08-07 ENCOUNTER — Ambulatory Visit (HOSPITAL_BASED_OUTPATIENT_CLINIC_OR_DEPARTMENT_OTHER)
Admission: RE | Admit: 2021-08-07 | Discharge: 2021-08-07 | Disposition: A | Discharge status: Home / Self Care | Payer: BC Managed Care – PPO | Source: Ambulatory Visit | Attending: Medical | Admitting: Medical

## 2021-08-07 ENCOUNTER — Other Ambulatory Visit: Payer: Self-pay

## 2021-08-07 ENCOUNTER — Ambulatory Visit: Payer: BC Managed Care – PPO | Admitting: Medical

## 2021-08-07 VITALS — BP 114/84 | HR 70 | Temp 98.2°F | Resp 18 | Ht 73.0 in | Wt 159.0 lb

## 2021-08-07 DIAGNOSIS — R0781 Pleurodynia: Secondary | ICD-10-CM | POA: Diagnosis not present

## 2021-08-07 DIAGNOSIS — R41840 Attention and concentration deficit: Secondary | ICD-10-CM

## 2021-08-07 MED ORDER — LISDEXAMFETAMINE DIMESYLATE 30 MG PO CAPS: 30.0000 mg | ORAL_CAPSULE | Freq: Every day | ORAL | 0 refills | Status: DC

## 2021-08-07 NOTE — Patient Instructions (Addendum)
For ADD(not ideal concentration presently)dc'd metadate CD and switch to vyvanse 30 mg daily. Uds up to date. Will update contract with new med. Recheck one month in office. Might increase dose if necessary.   For left lower rib region pain will get left rib xray series.   If no rib finding and if pain persists then consider [place] of abdomen since pain rt at junction left lower rib/lt upper abdomen region.  Follow up in one month or sooner if neeed.

## 2021-08-07 NOTE — Progress Notes (Signed)
Subjective:    Patient ID: Corey Sosa, male    DOB: [birth year], 45 y.o.   MRN: [id number]  HPI  Pt has history of ADD. He is on metadate cd. Formerly on concerta and was very helpful with  his concentration but not covered.   Pt states he thought metadate did not help as much as concerta. Pt states he wants to try vyvanse. He found out it is covered by his insurance. He has various coworkers that tell him it helps a lot. Pt also states fellow employees don't report appettite supression which he has struggle with.  Pt is also having some left lower  anterior upper rib area pain. Pain for about a month. Pain is constant low level dull. No constipation. No pain on urination. No back pain. No skin rash. No injury or fall. No working out. Pain for about a month. No association with viral illness. Pt tried ibuprofen and did not help. Also no black or bloody stools.  Review of Systems  Constitutional:  Negative for chills, fatigue and fever.  HENT:  Negative for congestion and dental problem.   Respiratory:  Negative for cough, chest tightness, shortness of breath and wheezing.   Cardiovascular:  Negative for chest pain and palpitations.  Gastrointestinal:  Negative for abdominal pain, constipation, diarrhea and nausea.  Genitourinary:  Negative for dysuria.  Musculoskeletal:  Negative for back pain.       Left lower rib/junction upper abd pain.  Skin:  Negative for rash.  Neurological:  Negative for dizziness, speech difficulty, weakness, numbness and headaches.  Hematological:  Negative for adenopathy. Does not bruise/bleed easily.  Psychiatric/Behavioral:  Positive for decreased concentration. Negative for behavioral problems, confusion, dysphoric mood, self-injury, sleep disturbance and suicidal ideas. The patient is not nervous/anxious and is not hyperactive.      Past Medical History:  Diagnosis Date   Shoulder pain      Social History   Socioeconomic History   Marital  status: Married    Spouse name: Not on file   Number of children: Not on file   Years of education: Not on file   Highest education level: Not on file  Occupational History   Not on file  Tobacco Use   Smoking status: Never   Smokeless tobacco: Never  Substance and Sexual Activity   Alcohol use: Yes    Comment: 1 beer twice a week.   Drug use: No   Sexual activity: Yes  Other Topics Concern   Not on file  Social History Narrative   Not on file   Social Determinants of Health   Financial Resource Strain: Not on file  Food Insecurity: Not on file  Transportation Needs: Not on file  Physical Activity: Not on file  Stress: Not on file  Social Connections: Not on file  Intimate Partner Violence: Not on file    No past surgical history on file.  Family History  Problem Relation Age of Onset   Cancer Mother    Stroke Father    Heart attack Father     No Known Allergies  Current Outpatient Medications on File Prior to Visit  Medication Sig Dispense Refill   methylphenidate (METADATE CD) 30 MG CR capsule Take 1 capsule (30 mg total) by mouth every morning. 30 capsule 0   No current facility-administered medications on file prior to visit.    BP 114/84   Pulse 70   Temp 98.2 F (36.8 C)   Resp 18  Ht 6\' 1"  (1.854 m)   Wt 159 lb (72.1 kg)   SpO2 98%   BMI 20.98 kg/m        Objective:   Physical Exam  General Mental Status- Alert. General Appearance- Not in acute distress.   Skin No rash in abdomen area.  Neck Carotid Arteries- Normal color. Moisture- Normal Moisture. No carotid bruits. No JVD.  Chest and Lung Exam Auscultation: Breath Sounds:-Normal.  Cardiovascular Auscultation:Rythm- Regular. Murmurs & Other Heart Sounds:Auscultation of the heart reveals- No Murmurs.  Neurologic Cranial Nerve exam:- CN Sosa-XII intact(No nystagmus), symmetric smile. strength:- 5/5 equal and symmetric strength both upper and lower extremities.        Assessment & Plan:   Patient Instructions  For ADD(not ideal concentration presently)dc'd metadate CD and switch to vyvanse 30 mg daily. Uds up to date. Will update contract with new med. Recheck one month in office. Might increase dose if necessary.   For left lower rib region pain will get left rib xray series.   If no rib finding and if pain persists then consider [place] of abdomen since pain rt at junction left lower rib/lt upper abdomen region.  Follow up in one month or sooner if neeed.    Mackie Pai, PA-C

## 2021-08-08 ENCOUNTER — Telehealth: Payer: Self-pay

## 2021-08-08 DIAGNOSIS — R1012 Left upper quadrant pain: Secondary | ICD-10-CM

## 2021-08-08 NOTE — Telephone Encounter (Signed)
Pt needs PA on vyvanse

## 2021-08-09 ENCOUNTER — Encounter: Payer: Self-pay | Admitting: Medical

## 2021-08-09 NOTE — Telephone Encounter (Signed)
PA approved --- 07/10/2021;Coverage End Date:08/09/2022   Pt notified of approval

## 2021-08-09 NOTE — Telephone Encounter (Signed)
PA started    Key:   [id number]

## 2021-08-14 NOTE — Telephone Encounter (Signed)
Patient would like [place] abd

## 2021-08-14 NOTE — Addendum Note (Signed)
Addended by: Anabel Halon on: 08/14/2021 03:07 PM   Modules accepted: Orders

## 2021-08-16 ENCOUNTER — Other Ambulatory Visit: Payer: Self-pay

## 2021-08-16 ENCOUNTER — Ambulatory Visit (HOSPITAL_BASED_OUTPATIENT_CLINIC_OR_DEPARTMENT_OTHER)
Admission: RE | Admit: 2021-08-16 | Discharge: 2021-08-16 | Disposition: A | Discharge status: Home / Self Care | Payer: BC Managed Care – PPO | Source: Ambulatory Visit | Attending: Medical | Admitting: Medical

## 2021-08-16 DIAGNOSIS — R109 Unspecified abdominal pain: Secondary | ICD-10-CM | POA: Diagnosis not present

## 2021-08-16 DIAGNOSIS — N281 Cyst of kidney, acquired: Secondary | ICD-10-CM | POA: Diagnosis not present

## 2021-08-16 DIAGNOSIS — R1012 Left upper quadrant pain: Secondary | ICD-10-CM | POA: Insufficient documentation

## 2021-09-11 ENCOUNTER — Encounter: Payer: Self-pay | Admitting: Medical

## 2021-09-11 MED ORDER — LISDEXAMFETAMINE DIMESYLATE 40 MG PO CAPS: 40.0000 mg | ORAL_CAPSULE | ORAL | 0 refills | Status: DC

## 2021-09-11 NOTE — Addendum Note (Signed)
Addended by: Anabel Halon on: 09/11/2021 08:19 PM   Modules accepted: Orders

## 2021-10-17 ENCOUNTER — Other Ambulatory Visit: Payer: Self-pay | Admitting: Medical

## 2021-10-18 ENCOUNTER — Ambulatory Visit (INDEPENDENT_AMBULATORY_CARE_PROVIDER_SITE_OTHER): Payer: BC Managed Care – PPO | Admitting: Medical

## 2021-10-18 VITALS — BP 119/84 | HR 74 | Resp 18 | Ht 73.0 in | Wt 158.6 lb

## 2021-10-18 DIAGNOSIS — Z125 Encounter for screening for malignant neoplasm of prostate: Secondary | ICD-10-CM

## 2021-10-18 DIAGNOSIS — R41840 Attention and concentration deficit: Secondary | ICD-10-CM | POA: Diagnosis not present

## 2021-10-18 DIAGNOSIS — R0781 Pleurodynia: Secondary | ICD-10-CM | POA: Diagnosis not present

## 2021-10-18 DIAGNOSIS — R1012 Left upper quadrant pain: Secondary | ICD-10-CM

## 2021-10-18 LAB — CBC WITH DIFFERENTIAL/PLATELET
Basophils Absolute: 0 10*3/uL (ref 0.0–0.1)
Basophils Relative: 0.4 % (ref 0.0–3.0)
Eosinophils Absolute: 0.1 10*3/uL (ref 0.0–0.7)
Eosinophils Relative: 1.2 % (ref 0.0–5.0)
HCT: 42.1 % (ref 39.0–52.0)
Hemoglobin: 14.2 g/dL (ref 13.0–17.0)
Lymphocytes Relative: 20.9 % (ref 12.0–46.0)
Lymphs Abs: 1 10*3/uL (ref 0.7–4.0)
MCHC: 33.8 g/dL (ref 30.0–36.0)
MCV: 89 fl (ref 78.0–100.0)
Monocytes Absolute: 0.3 10*3/uL (ref 0.1–1.0)
Monocytes Relative: 6.6 % (ref 3.0–12.0)
Neutro Abs: 3.5 10*3/uL (ref 1.4–7.7)
Neutrophils Relative %: 70.9 % (ref 43.0–77.0)
Platelets: 250 10*3/uL (ref 150.0–400.0)
RBC: 4.73 Mil/uL (ref 4.22–5.81)
RDW: 13.5 % (ref 11.5–15.5)
WBC: 4.9 10*3/uL (ref 4.0–10.5)

## 2021-10-18 LAB — COMPREHENSIVE METABOLIC PANEL
ALT: 16 U/L (ref 0–53)
AST: 17 U/L (ref 0–37)
Albumin: 4.7 g/dL (ref 3.5–5.2)
Alkaline Phosphatase: 85 U/L (ref 39–117)
BUN: 24 mg/dL — ABNORMAL HIGH (ref 6–23)
CO2: 30 mEq/L (ref 19–32)
Calcium: 9.7 mg/dL (ref 8.4–10.5)
Chloride: 104 mEq/L (ref 96–112)
Creatinine, Ser: 1.03 mg/dL (ref 0.40–1.50)
GFR: 88.79 mL/min (ref >60.00)
Glucose, Bld: 102 mg/dL — ABNORMAL HIGH (ref 70–99)
Potassium: 4.1 mEq/L (ref 3.5–5.1)
Sodium: 141 mEq/L (ref 135–145)
Total Bilirubin: 1.2 mg/dL (ref 0.2–1.2)
Total Protein: 7.1 g/dL (ref 6.0–8.3)

## 2021-10-18 LAB — POC URINALSYSI DIPSTICK (AUTOMATED)
Bilirubin, UA: NEGATIVE
Blood, UA: NEGATIVE
Glucose, UA: NEGATIVE
Ketones, UA: NEGATIVE
Leukocytes, UA: NEGATIVE
Nitrite, UA: NEGATIVE
Protein, UA: NEGATIVE
Spec Grav, UA: 1.02 (ref 1.010–1.025)
Urobilinogen, UA: 0.2 E.U./dL
pH, UA: 6 (ref 5.0–8.0)

## 2021-10-18 LAB — LIPASE: Lipase: 32 U/L (ref 11.0–59.0)

## 2021-10-18 LAB — PSA: PSA: 0.4 ng/mL (ref 0.10–4.00)

## 2021-10-18 MED ORDER — LISDEXAMFETAMINE DIMESYLATE 50 MG PO CAPS: 50.0000 mg | ORAL_CAPSULE | Freq: Every day | ORAL | 0 refills | Status: DC

## 2021-10-18 NOTE — Patient Instructions (Addendum)
For ADD will increase your vyvanse to 50 mg daily since you report not having adequate/ideal attention later in day.  For left upper abdomen/rib area pain for almost 3 months will refer you to both GI MD and sports medicine as not clear if possible GI cause vs musculoskeletal cause. So far work up rib xray and [place] abd negative. Today will get cbc, cmp, lipase and UA. If pain worsens or changes let us know. Can use ibuprofen and tylenol or meloxicam and tylenol. But not to use ibuprofen and meloxicam together.    Follow up 4 month for ADD but sooner if needed for change in abdomen pain.

## 2021-10-18 NOTE — Progress Notes (Signed)
Subjective:    Patient ID: Corey Sosa, male    DOB: 07-Jul-1976, 45 y.o.   MRN: [id number]  HPI  Pt had recent increase of his vyvanse dose to 40 mg. He thinks this works better than concerta overall. He explains that it seems to start loose effect around 4 pm. He takes med at 6 am. He was on concerta and he wanted to be switched to vyvanse. He thinks he may do better on higher dose. No htn and no tachcyardia.  Pt is up to date on controlled med contract and uds.   Pt is still having pain in left flank area/side. Pt described some pain on lat visit below in ".   "Pt is also having some left lower  anterior upper rib area pain. Pain for about a month. Pain is constant low level dull. No constipation. No pain on urination. No back pain. No skin rash. No injury or fall. No working out. Pain for about a month. No association with viral illness. Pt tried ibuprofen and did not help. Also no black or bloody stools."  Based on last visit exam and symptoms got xray of chest/ribs and was negative. Then later got [place].  "IMPRESSION: 1. No specific sonographic abnormality is identified to account for the patient's abdominal pain. 2. Incidental small right kidney upper pole simple appearing cyst."  Pt tells me pain is low level constant and dull most of the time. Occasional sharper pain. Pain is not on movement, sneezing or coughing.  Review of Systems  Constitutional:  Negative for chills, fatigue and fever.  Respiratory:  Negative for cough, chest tightness, shortness of breath and wheezing.   Cardiovascular:  Negative for chest pain and palpitations.  Gastrointestinal:  Negative for abdominal distention, abdominal pain, anal bleeding, constipation, diarrhea, rectal pain and vomiting.  Genitourinary:  Negative for dysuria, frequency, penile pain and testicular pain.  Musculoskeletal:  Negative for back pain.  Skin:  Negative for rash.  Neurological:  Negative for dizziness, seizures,  syncope, weakness and light-headedness.  Hematological:  Negative for adenopathy. Does not bruise/bleed easily.  Psychiatric/Behavioral:  Positive for decreased concentration. Negative for agitation, confusion, sleep disturbance and suicidal ideas. The patient is not nervous/anxious and is not hyperactive.    Past Medical History:  Diagnosis Date   Shoulder pain      Social History   Socioeconomic History   Marital status: Married    Spouse name: Not on file   Number of children: Not on file   Years of education: Not on file   Highest education level: Not on file  Occupational History   Not on file  Tobacco Use   Smoking status: Never   Smokeless tobacco: Never  Substance and Sexual Activity   Alcohol use: Yes    Comment: 1 beer twice a week.   Drug use: No   Sexual activity: Yes  Other Topics Concern   Not on file  Social History Narrative   Not on file   Social Determinants of Health   Financial Resource Strain: Not on file  Food Insecurity: Not on file  Transportation Needs: Not on file  Physical Activity: Not on file  Stress: Not on file  Social Connections: Not on file  Intimate Partner Violence: Not on file    No past surgical history on file.  Family History  Problem Relation Age of Onset   Cancer Mother    Stroke Father    Heart attack Father  No Known Allergies  Current Outpatient Medications on File Prior to Visit  Medication Sig Dispense Refill   lisdexamfetamine (VYVANSE) 40 MG capsule Take 1 capsule (40 mg total) by mouth every morning. 30 capsule 0   No current facility-administered medications on file prior to visit.    BP 119/84    Pulse 74    Resp 18    Ht 6\' 1"  (1.854 m)    Wt 158 lb 9.6 oz (71.9 kg)    SpO2 97%    BMI 20.92 kg/m       Objective:   Physical Exam  General Mental Status- Alert. General Appearance- Not in acute distress.   Skin General: Color- Normal Color. Moisture- Normal Moisture.  Neck Carotid Arteries-  Normal color. Moisture- Normal Moisture. No carotid bruits. No JVD.  Chest and Lung Exam Auscultation: Breath Sounds:-Normal.  Cardiovascular Auscultation:Rythm- Regular. Murmurs & Other Heart Sounds:Auscultation of the heart reveals- No Murmurs.  Abdomen Inspection:-Inspeection Normal. Palpation/Percussion:Note:No mass. Palpation and Percussion of the abdomen reveal- Non Tender, Non Distended + BS, no rebound or guarding.   Neurologic Cranial Nerve exam:- CN Sosa-XII intact(No nystagmus), symmetric smile. Strength:- 5/5 equal and symmetric strength both upper and lower extremities.      Assessment & Plan:   Patient Instructions  For ADD will increase your vyvanse to 50 mg daily since you report not having adequate/ideal attention later in day.  For left upper abdomen/rib area pain for almost 3 months will refer you to both GI MD and sports medicine as not clear if possible GI cause vs musculoskeletal cause. So far work up rib xray and [place] abd negative. Today will get cbc, cmp, lipase and UA. If pain worsens or changes let us know. Can use ibuprofen and tylenol or meloxicam and tylenol. But not to use ibuprofen and meloxicam together.    Follow up 4 month for ADD but sooner if needed for change in abdomen pain.   Mackie Pai, PA-C

## 2021-10-22 ENCOUNTER — Ambulatory Visit: Payer: BC Managed Care – PPO | Admitting: Medical

## 2021-11-08 ENCOUNTER — Telehealth: Payer: Self-pay | Admitting: Medical

## 2021-11-08 NOTE — Telephone Encounter (Signed)
Pt would like a copy of his ADHD diagnostics emailed to him @  [email or web address]. please advise.

## 2021-11-11 NOTE — Telephone Encounter (Signed)
Unable to print problem list , sent pt snapshot via email

## 2021-11-14 ENCOUNTER — Telehealth: Payer: Self-pay | Admitting: Gastroenterology

## 2021-11-14 NOTE — Telephone Encounter (Signed)
Patient had a OV with you for abd pain, stated that he was feeling better and that he had any other issues that he would call back to reschedule.

## 2021-11-14 NOTE — Telephone Encounter (Signed)
Good Morning Dr. Candis Schatz,  Patient called and canceled appointment for tomorrow due to feeling better.

## 2021-11-15 ENCOUNTER — Ambulatory Visit: Payer: BC Managed Care – PPO | Admitting: Gastroenterology

## 2021-11-15 NOTE — Telephone Encounter (Signed)
I see the issue, it is Dr. Vivia Ewing patient. My apologies.

## 2021-11-18 ENCOUNTER — Other Ambulatory Visit: Payer: Self-pay | Admitting: Medical

## 2021-11-18 MED ORDER — LISDEXAMFETAMINE DIMESYLATE 50 MG PO CAPS: 50.0000 mg | ORAL_CAPSULE | Freq: Every day | ORAL | 0 refills | Status: DC

## 2021-11-18 NOTE — Telephone Encounter (Addendum)
Requesting: vyvanse Contract:08/26/21 UDS:03/19/21 Last Visit:10/18/21 Next Visit:n/a Last Refill:10/18/21  Please Advise Rx vyvanse sent to pt pharmacy.  Mackie Pai, PA-C

## 2021-12-22 ENCOUNTER — Other Ambulatory Visit: Payer: Self-pay | Admitting: Medical

## 2021-12-23 MED ORDER — LISDEXAMFETAMINE DIMESYLATE 50 MG PO CAPS: 50.0000 mg | ORAL_CAPSULE | Freq: Every day | ORAL | 0 refills | Status: DC

## 2021-12-23 NOTE — Telephone Encounter (Addendum)
Requesting:Vyvanse Contract:08/26/21 UDS:03/19/21 Last Visit:10/18/21 Next Visit:Na Last Refill:11/14/21  Please Advise   Rx sent to pharamacy.  Mackie Pai, PA-C

## 2022-01-24 ENCOUNTER — Other Ambulatory Visit: Payer: Self-pay | Admitting: Medical

## 2022-01-24 MED ORDER — LISDEXAMFETAMINE DIMESYLATE 50 MG PO CAPS: 50.0000 mg | ORAL_CAPSULE | Freq: Every day | ORAL | 0 refills | Status: DC

## 2022-01-24 NOTE — Telephone Encounter (Addendum)
Requesting: Vyvanse ?Contract: 08/07/21 ?UDS: 03/19/21 ?Last Visit: 10/18/21 ?Next Visit: none ?Last Refill: 12/23/21 ? ?Please Advise  ? ?Rx vyvanse sent to pt pharmacy. ? ?Mackie Pai, PA-C  ?

## 2022-03-03 ENCOUNTER — Other Ambulatory Visit: Payer: Self-pay | Admitting: Medical

## 2022-03-03 NOTE — Telephone Encounter (Addendum)
Requesting: vyvanse ?Contract:08/07/2021 ?UDS:03/19/21 ?Last Visit:10/18/21 ?Next Visit:n/a ?Last Refill:01/24/22 ? ?Please Advise  ? ?Rx sent to pt pharmacy. He needs controlled visit with me before his next refill. Also needs updated uds. ? ?Mackie Pai, PA-C  ?

## 2022-03-04 MED ORDER — LISDEXAMFETAMINE DIMESYLATE 50 MG PO CAPS: 50.0000 mg | ORAL_CAPSULE | Freq: Every day | ORAL | 0 refills | Status: DC

## 2022-03-31 ENCOUNTER — Ambulatory Visit: Payer: Medicaid Other | Admitting: Medical

## 2022-03-31 ENCOUNTER — Telehealth: Payer: Medicaid Other | Admitting: Physician Assistant

## 2022-03-31 VITALS — BP 126/90 | HR 85 | Temp 97.8°F | Resp 18 | Ht 73.0 in | Wt 157.4 lb

## 2022-03-31 DIAGNOSIS — Z79899 Other long term (current) drug therapy: Secondary | ICD-10-CM | POA: Diagnosis not present

## 2022-03-31 DIAGNOSIS — R300 Dysuria: Secondary | ICD-10-CM

## 2022-03-31 DIAGNOSIS — R350 Frequency of micturition: Secondary | ICD-10-CM | POA: Diagnosis not present

## 2022-03-31 DIAGNOSIS — F988 Other specified behavioral and emotional disorders with onset usually occurring in childhood and adolescence: Secondary | ICD-10-CM

## 2022-03-31 LAB — POCT URINALYSIS DIPSTICK
Bilirubin, UA: NEGATIVE
Blood, UA: NEGATIVE
Glucose, UA: NEGATIVE
Nitrite, UA: NEGATIVE
Protein, UA: NEGATIVE
Spec Grav, UA: 1.015 (ref 1.010–1.025)
Urobilinogen, UA: 0.2 E.U./dL
pH, UA: 6 (ref 5.0–8.0)

## 2022-03-31 MED ORDER — SULFAMETHOXAZOLE-TRIMETHOPRIM 800-160 MG PO TABS: 1.0000 | ORAL_TABLET | Freq: Two times a day (BID) | ORAL | 0 refills | Status: DC

## 2022-03-31 NOTE — Progress Notes (Signed)
Subjective:    Patient ID: Corey Sosa, male    DOB: May 02, 1976, 46 y.o.   MRN: [id number]  HPI  Pt in for evaluation for some difficulty holding urine. Some frequent urination and some pain. Seems like he is having to urinate every 2 hours. This has been going on for 2 weeks.   No fever, no chills or sweats. Sometimes random rt side flank pain.   No perineum pain.  Pt had e visit    Pt also had ADD. Doing well with vyvanse 50 mg. Last May 2022 uds. Bp reasonably controlled. Last refill ws 03-04-2022      Review of Systems  Constitutional:  Negative for chills, fatigue and fever.  Respiratory:  Negative for cough, chest tightness, shortness of breath and wheezing.   Cardiovascular:  Negative for chest pain and palpitations.  Gastrointestinal:  Negative for abdominal pain, constipation, diarrhea, nausea and vomiting.  Genitourinary:  Positive for dysuria, frequency and urgency. Negative for decreased urine volume, difficulty urinating, penile pain and testicular pain.  Musculoskeletal:  Negative for back pain and neck pain.  Skin:  Negative for rash.   Past Medical History:  Diagnosis Date   Shoulder pain      Social History   Socioeconomic History   Marital status: Married    Spouse name: Not on file   Number of children: Not on file   Years of education: Not on file   Highest education level: Not on file  Occupational History   Not on file  Tobacco Use   Smoking status: Never   Smokeless tobacco: Never  Substance and Sexual Activity   Alcohol use: Yes    Comment: 1 beer twice a week.   Drug use: No   Sexual activity: Yes  Other Topics Concern   Not on file  Social History Narrative   Not on file   Social Determinants of Health   Financial Resource Strain: Not on file  Food Insecurity: Not on file  Transportation Needs: Not on file  Physical Activity: Not on file  Stress: Not on file  Social Connections: Not on file  Intimate Partner  Violence: Not on file    No past surgical history on file.  Family History  Problem Relation Age of Onset   Cancer Mother    Stroke Father    Heart attack Father     No Known Allergies  Current Outpatient Medications on File Prior to Visit  Medication Sig Dispense Refill   lisdexamfetamine (VYVANSE) 50 MG capsule Take 1 capsule (50 mg total) by mouth daily. 30 capsule 0   No current facility-administered medications on file prior to visit.    BP 126/90   Pulse 85   Temp 97.8 F (36.6 C)   Resp 18   Ht '6\' 1"'$  (1.854 m)   Wt 157 lb 6.4 oz (71.4 kg)   SpO2 100%   BMI 20.77 kg/m        Objective:   Physical Exam   General- No acute distress. Pleasant patient. Neck- Full range of motion, no jvd Lungs- Clear, even and unlabored. Heart- regular rate and rhythm. Neurologic- CNII- XII grossly intact.  Abdomen- soft, nt, nd, +bs, no rebound or guarding.no organomegally. Back- no cva tenderness.      Assessment & Plan:   Patient Instructions  Frequent urination with some dysuria.(Urine showed some infection fighting cells). Will get culture psa, cbc and rx bactrim DS antibiotic. You may have prostatitis or uti.  For ADD well controlled on adderall 50 mg daily. Will get uds and have you signed control med contract.  Follow up in 10 days or sooner if needed   [employer], PA-C

## 2022-03-31 NOTE — Progress Notes (Signed)
E-Visit for Urinary Problems  Based on what you shared with me, I feel your condition warrants further evaluation and I recommend that you be seen for a face to face office visit.  Male bladder infections are not very common.  We worry about prostate or kidney conditions.  The standard of care is to examine the abdomen and kidneys, and to do a urine and blood test to make sure that something more serious is not going on.  We recommend that you see a provider today.  If your doctor's office is closed [facility] has the following Urgent Cares:    NOTE: You will not be charged for this e-visit.  If you are having a true medical emergency please call 911.       For an urgent face to face visit, [facility] has six urgent care centers for your convenience:     [facility] [place] [facility] [place] Get Driving Directions [phone] [place], [place] [address]    [facility]) Get Driving Directions [phone] [place], [place] [address]  [facility] [place] [facility]) Get Driving Directions [phone] [place],  [place]  [address]  [facility]  Get Driving Directions [phone] [place], [place], [place] [address]   [facility] [place] Get Driving Directions  [phone] [address] [place], [place] [address]   [facility] [place] Get Driving Directions [phone] [address] [place],  [address]  Your MyChart E-visit questionnaire answers were reviewed by a board certified advanced clinical practitioner to complete your personal care plan based on your specific symptoms.  Thank you for using e-Visits.    Approximately 5 minutes was spent documenting and reviewing patient's chart.

## 2022-03-31 NOTE — Patient Instructions (Signed)
Frequent urination with some dysuria.(Urine showed some infection fighting cells). Will get culture psa, cbc and rx bactrim DS antibiotic. You may have prostatitis or uti.   For ADD well controlled on adderall 50 mg daily. Will get uds and have you signed control med contract.  Follow up in 10 days or sooner if needed

## 2022-04-01 LAB — CBC WITH DIFFERENTIAL/PLATELET
Basophils Absolute: 0.1 10*3/uL (ref 0.0–0.1)
Basophils Relative: 1 % (ref 0.0–3.0)
Eosinophils Absolute: 0.1 10*3/uL (ref 0.0–0.7)
Eosinophils Relative: 1.4 % (ref 0.0–5.0)
HCT: 42.7 % (ref 39.0–52.0)
Hemoglobin: 14.4 g/dL (ref 13.0–17.0)
Lymphocytes Relative: 18.8 % (ref 12.0–46.0)
Lymphs Abs: 1.1 10*3/uL (ref 0.7–4.0)
MCHC: 33.7 g/dL (ref 30.0–36.0)
MCV: 90.4 fl (ref 78.0–100.0)
Monocytes Absolute: 0.3 10*3/uL (ref 0.1–1.0)
Monocytes Relative: 5.2 % (ref 3.0–12.0)
Neutro Abs: 4.4 10*3/uL (ref 1.4–7.7)
Neutrophils Relative %: 73.6 % (ref 43.0–77.0)
Platelets: 260 10*3/uL (ref 150.0–400.0)
RBC: 4.72 Mil/uL (ref 4.22–5.81)
RDW: 13.5 % (ref 11.5–15.5)
WBC: 6 10*3/uL (ref 4.0–10.5)

## 2022-04-01 LAB — PSA: PSA: 0.77 ng/mL (ref 0.10–4.00)

## 2022-04-03 ENCOUNTER — Other Ambulatory Visit: Payer: Self-pay | Admitting: Medical

## 2022-04-03 LAB — DRUG MONITORING PANEL 376104, URINE
Amphetamine: 3558 ng/mL — ABNORMAL HIGH (ref <250)
Amphetamines: POSITIVE ng/mL — AB (ref <500)
Barbiturates: NEGATIVE ng/mL (ref <300)
Benzodiazepines: NEGATIVE ng/mL (ref <100)
Cocaine Metabolite: NEGATIVE ng/mL (ref <150)
Desmethyltramadol: NEGATIVE ng/mL (ref <100)
Methamphetamine: NEGATIVE ng/mL (ref <250)
Opiates: NEGATIVE ng/mL (ref <100)
Oxycodone: NEGATIVE ng/mL (ref <100)
Tramadol: NEGATIVE ng/mL (ref <100)

## 2022-04-03 LAB — URINE CULTURE
MICRO NUMBER:: 13483538
SPECIMEN QUALITY:: ADEQUATE

## 2022-04-03 LAB — DM TEMPLATE

## 2022-04-04 MED ORDER — LISDEXAMFETAMINE DIMESYLATE 50 MG PO CAPS: 50.0000 mg | ORAL_CAPSULE | Freq: Every day | ORAL | 0 refills | Status: DC

## 2022-04-04 NOTE — Telephone Encounter (Signed)
Requesting: Vyvanse '50mg'$   Contract: 03/31/22 UDS: 03/31/22 Last Visit: 03/31/22 Next Visit: None Last Refill: 03/04/22 #30 and 0RF  Please Advise

## 2022-05-06 ENCOUNTER — Other Ambulatory Visit: Payer: Self-pay | Admitting: Family Medicine

## 2022-05-07 MED ORDER — LISDEXAMFETAMINE DIMESYLATE 50 MG PO CAPS: 50.0000 mg | ORAL_CAPSULE | Freq: Every day | ORAL | 0 refills | Status: DC

## 2022-05-07 NOTE — Telephone Encounter (Addendum)
Requesting: Vyvanse '50mg'$  Contract:03/31/22 UDS:03/31/22 Last Visit: 03/31/22 Next Visit: None Last Refill: 04/04/22 #30 and 0RF  Please Advise  Rx refill sent to pt pharmacy.

## 2022-06-09 ENCOUNTER — Other Ambulatory Visit: Payer: Self-pay | Admitting: Medical

## 2022-06-10 MED ORDER — LISDEXAMFETAMINE DIMESYLATE 50 MG PO CAPS: 50.0000 mg | ORAL_CAPSULE | Freq: Every day | ORAL | 0 refills | Status: DC

## 2022-06-10 NOTE — Telephone Encounter (Signed)
Refilled add med.

## 2022-07-09 ENCOUNTER — Other Ambulatory Visit: Payer: Self-pay | Admitting: Medical

## 2022-07-09 NOTE — Telephone Encounter (Addendum)
Requesting: Vyvanse '50mg'$   Contract:03/31/22 UDS: 03/31/22 Last Visit: 03/31/22 Next Visit: None Last Refill: 06/10/22 #30 and 0RF  Please Advise  Rx refill sent.  Mackie Pai, PA-C

## 2022-07-10 MED ORDER — LISDEXAMFETAMINE DIMESYLATE 50 MG PO CAPS: 50.0000 mg | ORAL_CAPSULE | Freq: Every day | ORAL | 0 refills | Status: DC

## 2022-08-11 ENCOUNTER — Other Ambulatory Visit: Payer: Self-pay | Admitting: Medical

## 2022-08-11 MED ORDER — LISDEXAMFETAMINE DIMESYLATE 50 MG PO CAPS: 50.0000 mg | ORAL_CAPSULE | Freq: Every day | ORAL | 0 refills | Status: DC

## 2022-08-11 NOTE — Telephone Encounter (Addendum)
Requesting: Vyvanse '50mg'$   Contract: 03/31/22 UDS: 03/31/22 Last Visit: 03/31/22 Next Visit: None Last Refill: 07/10/22 #30 and 0RF   Please Advise   Rx refill sent to pt pharmacy.  Mackie Pai, PA-C

## 2022-09-16 ENCOUNTER — Other Ambulatory Visit: Payer: Self-pay | Admitting: Medical

## 2022-09-16 MED ORDER — LISDEXAMFETAMINE DIMESYLATE 50 MG PO CAPS: 50.0000 mg | ORAL_CAPSULE | Freq: Every day | ORAL | 0 refills | Status: DC

## 2022-09-16 NOTE — Telephone Encounter (Addendum)
Requesting: Vyvanse '50mg'$   Contract: 03/31/22 UDS: 03/31/22 Last Visit: 03/31/22 Next Visit: None Last Refill: 08/11/22 #30 and 0RF   Please Advise  Refilled his med but please get him scheduled for cotrolled me visit by 09-30-2022.  Mackie Pai, PA-C

## 2022-10-16 ENCOUNTER — Other Ambulatory Visit: Payer: Self-pay | Admitting: Medical

## 2022-10-16 NOTE — Telephone Encounter (Signed)
Requesting: Vyvanse '50mg'$   Contract: 03/31/22 UDS: 03/31/22 Last Visit: 03/31/22 Next Visit: None Last Refill: 09/16/22 #30 and 0RF  Please Advise

## 2022-10-16 NOTE — Telephone Encounter (Signed)
Pt stated he would like to have all medications going forward to go to the following pharmacy and to remove the current pharmacy:  Prescription Request  10/16/2022  Is this a "Controlled Substance" medicine? Yes  LOV: Visit date not found  What is the name of the medication or equipment?   lisdexamfetamine (VYVANSE) 50 MG capsule [[id number]]   Have you contacted your pharmacy to request a refill? No   Which pharmacy would you like this sent to?   [facility], [place], [place] [address] P: [phone]    Patient notified that their request is being sent to the clinical staff for review and that they should receive a response within 2 business days.   Please advise at Mobile [phone] (mobile)

## 2022-11-19 ENCOUNTER — Ambulatory Visit: Payer: Medicaid Other | Admitting: Medical

## 2022-11-19 VITALS — BP 112/76 | HR 68 | Temp 98.0°F | Resp 18 | Ht 73.0 in | Wt 155.4 lb

## 2022-11-19 DIAGNOSIS — F988 Other specified behavioral and emotional disorders with onset usually occurring in childhood and adolescence: Secondary | ICD-10-CM

## 2022-11-19 DIAGNOSIS — G4700 Insomnia, unspecified: Secondary | ICD-10-CM | POA: Diagnosis not present

## 2022-11-19 MED ORDER — LISDEXAMFETAMINE DIMESYLATE 50 MG PO CAPS: 50.0000 mg | ORAL_CAPSULE | Freq: Every day | ORAL | 0 refills | Status: DC

## 2022-11-19 MED ORDER — TRAZODONE HCL 50 MG PO TABS: 25.0000 mg | ORAL_TABLET | Freq: Every evening | ORAL | 1 refills | Status: DC | PRN

## 2022-11-19 NOTE — Progress Notes (Signed)
Subjective:    Patient ID: Corey Sosa, male    DOB: [birth year], 47 y.o.   MRN: [id number]  HPI  Pt in for follow up on ADD.   Pt has ADD. Pt is on vyvanse 50 mg daily. Pt ran out of med 3 weeks ago. 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 No side effects that he reports. Some slight wt loss since last visit only 2 pounds. No tachycardia. No htn.   Some insomnia past 2-3 months  on days when shift change and he works night and has to sleep during the day. Also his baby may be playing a role as well. On review of chart and per pt new problems.   Review of Systems  Constitutional:  Negative for chills, fatigue and fever.  HENT:  Negative for congestion and drooling.   Respiratory:  Negative for cough, chest tightness and shortness of breath.   Cardiovascular:  Negative for chest pain and palpitations.  Gastrointestinal:  Negative for abdominal pain.  Genitourinary:  Negative for dysuria, frequency and hematuria.  Musculoskeletal:  Negative for back pain, myalgias and neck pain.  Skin:  Negative for rash.  Psychiatric/Behavioral:  Positive for decreased concentration. Negative for behavioral problems, sleep disturbance and suicidal ideas. The patient is not nervous/anxious and is not hyperactive.        Concentration controlled with med.    Past Medical History:  Diagnosis Date   Shoulder pain      Social History   Socioeconomic History   Marital status: Married    Spouse name: Not on file   Number of children: Not on file   Years of education: Not on file   Highest education level: Not on file  Occupational History   Not on file  Tobacco Use   Smoking status: Never   Smokeless tobacco: Never  Substance and Sexual Activity   Alcohol use: Yes    Comment: 1 beer twice a week.   Drug use: No   Sexual activity: Yes  Other Topics Concern   Not on file  Social History Narrative   Not on file   Social Determinants of Health   Financial Resource  Strain: Not on file  Food Insecurity: Not on file  Transportation Needs: Not on file  Physical Activity: Not on file  Stress: Not on file  Social Connections: Not on file  Intimate Partner Violence: Not on file    No past surgical history on file.  Family History  Problem Relation Age of Onset   Cancer Mother    Stroke Father    Heart attack Father     No Known Allergies  Current Outpatient Medications on File Prior to Visit  Medication Sig Dispense Refill   lisdexamfetamine (VYVANSE) 50 MG capsule Take 1 capsule (50 mg total) by mouth daily. 30 capsule 0   No current facility-administered medications on file prior to visit.    BP 112/76   Pulse 68   Temp 98 F (36.7 C)   Resp 18   Ht '6\' 1"'$  (1.854 m)   Wt 155 lb 6.4 oz (70.5 kg)   SpO2 100%   BMI 20.50 kg/m        Objective:   Physical Exam  General Mental Status- Alert. General Appearance- Not in acute distress.   Chest and Lung Exam Auscultation: Breath Sounds:-Normal.  Cardiovascular Auscultation:Rythm- Regular. Murmurs & Other Heart Sounds:Auscultation of the heart reveals- No Murmurs.  Abdomen Inspection:-Inspeection Normal. Palpation/Percussion:Note:No mass.  Palpation and Percussion of the abdomen reveal- Non Tender, Non Distended + BS, no rebound or guarding.  Neurologic Cranial Nerve exam:- CN Sosa-XII intact(No nystagmus), symmetric smile. Strength:- 5/5 equal and symmetric strength both upper and lower extremities.       Assessment & Plan:   Patient Instructions  ADD well controlled on vyvanse 50 mg daily. Up to date on contract and uds. Stable with good concentration on meds and no significant side effects.  For insomnia can trazadone on days that you have to sleep during the days due to work shift change. Hopefully your plans to switch jobs will work out.  Follow up Early June or sooner if needed.     Mackie Pai, PA-C

## 2022-11-19 NOTE — Patient Instructions (Addendum)
ADD well controlled on vyvanse 50 mg daily. Up to date on contract and uds. Stable with good concentration on meds and no significant side effects. Refilled med today.  For insomnia can trazadone on days that you have to sleep during the days due to work shift change. Hopefully your plans to switch jobs will work out.  Follow up Early June or sooner if needed.

## 2022-12-19 ENCOUNTER — Telehealth: Payer: Self-pay | Admitting: Medical

## 2022-12-19 NOTE — Telephone Encounter (Signed)
Pt called to advise that [facility] is out of stock of Vyvanse so he would like for [place] to send the prescription to [facility] in [place].

## 2022-12-19 NOTE — Telephone Encounter (Signed)
Correct pharmacy loaded  Requesting: vyvanse Contract:03/31/22 UDS:03/31/22 Last Visit:11/19/22 Next Visit:n/a Last Refill:11/19/22  Please Advise

## 2022-12-20 MED ORDER — LISDEXAMFETAMINE DIMESYLATE 50 MG PO CAPS: 50.0000 mg | ORAL_CAPSULE | Freq: Every day | ORAL | 0 refills | Status: DC

## 2022-12-20 NOTE — Telephone Encounter (Signed)
Rx sent to pharmacy   

## 2023-01-22 ENCOUNTER — Other Ambulatory Visit: Payer: Self-pay | Admitting: Medical

## 2023-01-22 MED ORDER — LISDEXAMFETAMINE DIMESYLATE 50 MG PO CAPS: 50.0000 mg | ORAL_CAPSULE | Freq: Every day | ORAL | 0 refills | Status: DC

## 2023-01-22 NOTE — Telephone Encounter (Signed)
Refilled rx today. Have pt schedule appointment late May for controlled med visit.

## 2023-01-22 NOTE — Telephone Encounter (Signed)
Requesting: Vyvanse 50mg   Contract: 03/31/22 UDS: 03/31/22 Last Visit: 11/19/22 Next Visit: None Last Refill: 12/20/22 #30 and 0RF   Please Advise

## 2023-02-21 ENCOUNTER — Other Ambulatory Visit: Payer: Self-pay | Admitting: Medical

## 2023-02-23 MED ORDER — LISDEXAMFETAMINE DIMESYLATE 50 MG PO CAPS: 50.0000 mg | ORAL_CAPSULE | Freq: Every day | ORAL | 0 refills | Status: DC

## 2023-02-23 NOTE — Telephone Encounter (Signed)
Requesting: vyvanse Contract:03/31/22 UDS:03/31/22 Last Visit:11/19/22 Next Visit:n/a Last Refill:01/22/23  Please Advise

## 2023-02-23 NOTE — Telephone Encounter (Signed)
Rx sent. Please get him scheduled before April 01, 2023 when his contract and uds expire.

## 2023-03-27 ENCOUNTER — Other Ambulatory Visit: Payer: Self-pay | Admitting: Medical

## 2023-03-27 NOTE — Telephone Encounter (Signed)
Requesting: vyvanse Contract:03/31/22 UDS:03/31/22 Last Visit:11/19/22 Next Visit:n/a Last Refill:02/23/23  Please Advise

## 2023-03-28 MED ORDER — LISDEXAMFETAMINE DIMESYLATE 50 MG PO CAPS: 50.0000 mg | ORAL_CAPSULE | Freq: Every day | ORAL | 0 refills | Status: DC

## 2023-03-28 NOTE — Telephone Encounter (Signed)
Filled med. He is due for office visit before next refill. Also needs uds and new  contract when he comes in.

## 2023-05-09 ENCOUNTER — Other Ambulatory Visit: Payer: Self-pay | Admitting: Medical

## 2023-05-11 NOTE — Telephone Encounter (Signed)
Requesting: Vyvanse Contract: 03/31/2022 UDS: 03/31/2022 Last Visit: 11/19/2022 Next Visit: N/A Last Refill: 03/28/2023  Please Advise

## 2023-05-11 NOTE — Telephone Encounter (Signed)
Pt scheduled for 05/15/23.

## 2023-05-15 ENCOUNTER — Ambulatory Visit: Payer: Medicaid Other | Admitting: Medical

## 2023-05-21 ENCOUNTER — Other Ambulatory Visit: Payer: Self-pay

## 2023-05-21 ENCOUNTER — Other Ambulatory Visit (HOSPITAL_BASED_OUTPATIENT_CLINIC_OR_DEPARTMENT_OTHER): Payer: Self-pay

## 2023-05-21 ENCOUNTER — Encounter: Payer: Self-pay | Admitting: Medical

## 2023-05-21 ENCOUNTER — Telehealth: Payer: Self-pay

## 2023-05-21 ENCOUNTER — Ambulatory Visit: Payer: Medicaid Other | Admitting: Medical

## 2023-05-21 VITALS — BP 120/70 | HR 90 | Ht 73.0 in | Wt 156.0 lb

## 2023-05-21 DIAGNOSIS — Z79899 Other long term (current) drug therapy: Secondary | ICD-10-CM | POA: Diagnosis not present

## 2023-05-21 DIAGNOSIS — F988 Other specified behavioral and emotional disorders with onset usually occurring in childhood and adolescence: Secondary | ICD-10-CM | POA: Diagnosis not present

## 2023-05-21 MED ORDER — LISDEXAMFETAMINE DIMESYLATE 50 MG PO CAPS
50.0000 mg | ORAL_CAPSULE | Freq: Every day | ORAL | 0 refills | Status: DC
  Filled 2023-05-21: qty 30, 30d supply, fill #0

## 2023-05-21 MED ORDER — BUPROPION HCL ER (XL) 150 MG PO TB24
150.0000 mg | ORAL_TABLET | Freq: Every day | ORAL | 0 refills | Status: DC
  Filled 2023-05-21: qty 30, 30d supply, fill #0

## 2023-05-21 NOTE — Telephone Encounter (Signed)
PA initiated via Covermymeds; KEY: [id number]. PA approved.   PA Case: [id number], Status: Approved, Coverage Starts on: 05/21/2023 12:00:00 AM, Coverage Ends on: 05/20/2024 12:00:00 AM. Authorization Expiration Date: 05/19/2024

## 2023-05-21 NOTE — Patient Instructions (Addendum)
1. High risk medication use - Drug Monitoring Panel [id number] , Urine  2. Attention deficit disorder (ADD) without hyperactivity Continue vyvanse 50 mg daily. On discussion since attention tapering off end of day/early afternoon can add on low dose Wellbutrin rather than increase higher end vyvanse dose.   Updated contract and uds today/   Follow up 6 months controlled visit or sooner if needed. But ask you send me my chart update on how doing with wellbutrin in one month.

## 2023-05-21 NOTE — Progress Notes (Signed)
Subjective:    Patient ID: Corey Sosa, male    DOB: 1976/01/11, 47 y.o.   MRN: [id number]  HPI  Pt in for follow up on ADD.    Pt has ADD. Pt is on vyvanse 50 mg daily.  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 No side effects that he reports. No tachycardia. No htn. Take med at 8 am and by 3:30 pm starts feel waining effect.   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 He states meds are limited they can be used in [place]. They allow concerta and adderal. He may live in [place] in future.  No hx of seizures or eating disorder.  Discussed and decided to stay on vyvanse.    Review of Systems  Constitutional:  Negative for chills and fatigue.  Respiratory:  Negative for chest tightness, shortness of breath and wheezing.   Cardiovascular:  Negative for chest pain and palpitations.  Gastrointestinal:  Negative for abdominal pain.  Musculoskeletal:  Negative for back pain and myalgias.  Skin:  Negative for rash.  Neurological:  Negative for tremors, weakness and light-headedness.  Psychiatric/Behavioral:  Positive for decreased concentration. Negative for behavioral problems, hallucinations and suicidal ideas. The patient is not nervous/anxious.     Past Medical History:  Diagnosis Date   Shoulder pain      Social History   Socioeconomic History   Marital status: Married    Spouse name: Not on file   Number of children: Not on file   Years of education: Not on file   Highest education level: Not on file  Occupational History   Not on file  Tobacco Use   Smoking status: Never   Smokeless tobacco: Never  Substance and Sexual Activity   Alcohol use: Yes    Comment: 1 beer twice a week.   Drug use: No   Sexual activity: Yes  Other Topics Concern   Not on file  Social History Narrative   Not on file   Social Determinants of Health   Financial Resource Strain: Not on file  Food Insecurity: Not on file  Transportation Needs: Not on file   Physical Activity: Not on file  Stress: Not on file  Social Connections: Not on file  Intimate Partner Violence: Not on file    No past surgical history on file.  Family History  Problem Relation Age of Onset   Cancer Mother    Stroke Father    Heart attack Father     No Known Allergies  Current Outpatient Medications on File Prior to Visit  Medication Sig Dispense Refill   lisdexamfetamine (VYVANSE) 50 MG capsule Take 1 capsule (50 mg total) by mouth daily. 30 capsule 0   traZODone (DESYREL) 50 MG tablet Take 0.5-1 tablets (25-50 mg total) by mouth at bedtime as needed for sleep. 30 tablet 1   No current facility-administered medications on file prior to visit.    BP 120/70   Pulse 90   Ht 6\' 1"  (1.854 m)   Wt 156 lb (70.8 kg)   SpO2 99%   BMI 20.58 kg/m        Objective:   Physical Exam  General Mental Status- Alert. General Appearance- Not in acute distress.   Skin General: Color- Normal Color. Moisture- Normal Moisture.  Neck Carotid Arteries- Normal color. Moisture- Normal Moisture. No carotid bruits. No JVD.  Chest and Lung Exam Auscultation: Breath Sounds:-Normal.  Cardiovascular Auscultation:Rythm- Regular. Murmurs &  Other Heart Sounds:Auscultation of the heart reveals- No Murmurs.    Neurologic Cranial Nerve exam:- CN Sosa-XII intact(No nystagmus), symmetric smile. Strength:- 5/5 equal and symmetric strength both upper and lower extremities.       Assessment & Plan:   Patient Instructions  1. High risk medication use - Drug Monitoring Panel [id number] , Urine  2. Attention deficit disorder (ADD) without hyperactivity Continue vyvanse 50 mg daily. On discussion since attention tapering off end of day/early afternoon can add on low dose Wellbutrin rather than increase higher end vyvanse dose.     Follow up 6 months controlled visit or sooner if needed. But ask you send me my chart update on how doing with wellbutrin in one month.    Esperanza Richters, PA-C

## 2023-05-22 ENCOUNTER — Other Ambulatory Visit: Payer: Self-pay

## 2023-06-22 ENCOUNTER — Other Ambulatory Visit: Payer: Self-pay | Admitting: Medical

## 2023-06-22 NOTE — Telephone Encounter (Signed)
Requesting: Vyvanse 50mg   Contract: 05/29/23 UDS: 05/21/23 Last Visit: 05/21/23 Next Visit: 11/23/23 Last Refill: 05/21/23 #30 and 0RF  Please Advise

## 2023-06-23 ENCOUNTER — Other Ambulatory Visit (HOSPITAL_BASED_OUTPATIENT_CLINIC_OR_DEPARTMENT_OTHER): Payer: Self-pay

## 2023-06-23 MED ORDER — LISDEXAMFETAMINE DIMESYLATE 50 MG PO CAPS
50.0000 mg | ORAL_CAPSULE | Freq: Every day | ORAL | 0 refills | Status: DC
  Filled 2023-06-23: qty 30, 30d supply, fill #0

## 2023-06-23 NOTE — Telephone Encounter (Signed)
Rx refill sent to pharmacy. 

## 2023-07-22 ENCOUNTER — Other Ambulatory Visit: Payer: Self-pay | Admitting: Medical

## 2023-07-22 ENCOUNTER — Other Ambulatory Visit (HOSPITAL_BASED_OUTPATIENT_CLINIC_OR_DEPARTMENT_OTHER): Payer: Self-pay

## 2023-07-22 MED ORDER — LISDEXAMFETAMINE DIMESYLATE 50 MG PO CAPS
50.0000 mg | ORAL_CAPSULE | Freq: Every day | ORAL | 0 refills | Status: DC
  Filled 2023-07-22 – 2023-08-06 (×2): qty 30, 30d supply, fill #0

## 2023-07-22 NOTE — Telephone Encounter (Signed)
Requesting: vyvanse Contract:05/29/23 UDS:05/21/23 Last Visit:05/21/23 Next Visit:11/23/23 Last Refill:06/23/23  Please Advise

## 2023-07-22 NOTE — Telephone Encounter (Signed)
Rx refill sent to pharmacy. 

## 2023-08-06 ENCOUNTER — Other Ambulatory Visit (HOSPITAL_BASED_OUTPATIENT_CLINIC_OR_DEPARTMENT_OTHER): Payer: Self-pay

## 2023-08-24 ENCOUNTER — Other Ambulatory Visit (HOSPITAL_BASED_OUTPATIENT_CLINIC_OR_DEPARTMENT_OTHER): Payer: Self-pay

## 2023-08-24 MED ORDER — AMOXICILLIN-POT CLAVULANATE 875-125 MG PO TABS
1.0000 | ORAL_TABLET | Freq: Two times a day (BID) | ORAL | 0 refills | Status: DC
  Filled 2023-08-24 – 2023-09-07 (×2): qty 20, 10d supply, fill #0

## 2023-08-24 MED ORDER — LEVALBUTEROL TARTRATE 45 MCG/ACT IN AERO
2.0000 | INHALATION_SPRAY | RESPIRATORY_TRACT | 0 refills | Status: DC | PRN
  Filled 2023-08-24: qty 15, 30d supply, fill #0

## 2023-08-24 MED ORDER — ALBUTEROL SULFATE HFA 108 (90 BASE) MCG/ACT IN AERS
2.0000 | INHALATION_SPRAY | Freq: Four times a day (QID) | RESPIRATORY_TRACT | 0 refills | Status: DC | PRN
  Filled 2023-08-24: qty 18, 25d supply, fill #0

## 2023-09-07 ENCOUNTER — Other Ambulatory Visit: Payer: Self-pay

## 2023-09-07 ENCOUNTER — Other Ambulatory Visit (HOSPITAL_BASED_OUTPATIENT_CLINIC_OR_DEPARTMENT_OTHER): Payer: Self-pay

## 2023-09-07 ENCOUNTER — Other Ambulatory Visit: Payer: Self-pay | Admitting: Medical

## 2023-09-07 MED ORDER — LISDEXAMFETAMINE DIMESYLATE 50 MG PO CAPS
50.0000 mg | ORAL_CAPSULE | Freq: Every day | ORAL | 0 refills | Status: DC
  Filled 2023-09-07: qty 30, 30d supply, fill #0

## 2023-09-07 NOTE — Telephone Encounter (Signed)
Requesting: adderall Contract:06/18/23 UDS:06/18/23 Last Visit:05/21/23 Next Visit:11/23/23 Last Refill:07/22/23  Please Advise

## 2023-09-07 NOTE — Telephone Encounter (Signed)
Rx refill sent to pharmacy. 

## 2023-10-08 ENCOUNTER — Other Ambulatory Visit: Payer: Self-pay | Admitting: Medical

## 2023-10-09 ENCOUNTER — Other Ambulatory Visit (HOSPITAL_BASED_OUTPATIENT_CLINIC_OR_DEPARTMENT_OTHER): Payer: Self-pay

## 2023-10-09 MED ORDER — LISDEXAMFETAMINE DIMESYLATE 50 MG PO CAPS
50.0000 mg | ORAL_CAPSULE | Freq: Every day | ORAL | 0 refills | Status: DC
  Filled 2023-10-09: qty 30, 30d supply, fill #0

## 2023-10-09 NOTE — Telephone Encounter (Signed)
Requesting: vyvanse Contract: 05/29/23 UDS:05/29/23 Last Visit:05/21/23 Next Visit:11/23/23 Last Refill:09/07/23  Please Advise

## 2023-10-09 NOTE — Telephone Encounter (Signed)
Rx refill sent to pharmacy. 

## 2023-10-12 ENCOUNTER — Other Ambulatory Visit (HOSPITAL_BASED_OUTPATIENT_CLINIC_OR_DEPARTMENT_OTHER): Payer: Self-pay

## 2023-10-12 ENCOUNTER — Other Ambulatory Visit: Payer: Self-pay | Admitting: Medical Genetics

## 2023-10-13 ENCOUNTER — Other Ambulatory Visit (HOSPITAL_COMMUNITY)
Admission: RE | Admit: 2023-10-13 | Discharge: 2023-10-13 | Disposition: A | Discharge status: Home / Self Care | Payer: Self-pay | Source: Ambulatory Visit | Attending: Oncology | Admitting: Oncology

## 2023-10-26 LAB — GENECONNECT MOLECULAR SCREEN: Genetic Analysis Overall Interpretation: NEGATIVE

## 2023-11-09 ENCOUNTER — Other Ambulatory Visit (HOSPITAL_BASED_OUTPATIENT_CLINIC_OR_DEPARTMENT_OTHER): Payer: Self-pay

## 2023-11-09 ENCOUNTER — Other Ambulatory Visit: Payer: Self-pay | Admitting: Medical

## 2023-11-09 MED ORDER — LISDEXAMFETAMINE DIMESYLATE 50 MG PO CAPS
50.0000 mg | ORAL_CAPSULE | Freq: Every day | ORAL | 0 refills | Status: DC
  Filled 2023-11-09: qty 30, 30d supply, fill #0

## 2023-11-09 NOTE — Telephone Encounter (Signed)
 Requesting: vyvanse Contract:05/29/23 UDS:05/29/23 Last Visit:05/21/23 Next Visit:11/23/23 Last Refill:10/09/23  Please Advise

## 2023-11-23 ENCOUNTER — Ambulatory Visit: Payer: Medicaid Other | Admitting: Medical

## 2023-11-23 ENCOUNTER — Encounter: Payer: Self-pay | Admitting: Medical

## 2023-11-23 VITALS — BP 118/82 | HR 64 | Temp 98.0°F | Resp 16 | Ht 73.0 in | Wt 161.0 lb

## 2023-11-23 DIAGNOSIS — F988 Other specified behavioral and emotional disorders with onset usually occurring in childhood and adolescence: Secondary | ICD-10-CM | POA: Diagnosis not present

## 2023-11-23 DIAGNOSIS — D170 Benign lipomatous neoplasm of skin and subcutaneous tissue of head, face and neck: Secondary | ICD-10-CM | POA: Diagnosis not present

## 2023-11-23 DIAGNOSIS — R82998 Other abnormal findings in urine: Secondary | ICD-10-CM | POA: Diagnosis not present

## 2023-11-23 DIAGNOSIS — Z125 Encounter for screening for malignant neoplasm of prostate: Secondary | ICD-10-CM

## 2023-11-23 DIAGNOSIS — Z23 Encounter for immunization: Secondary | ICD-10-CM

## 2023-11-23 DIAGNOSIS — G8929 Other chronic pain: Secondary | ICD-10-CM

## 2023-11-23 DIAGNOSIS — Z1211 Encounter for screening for malignant neoplasm of colon: Secondary | ICD-10-CM

## 2023-11-23 DIAGNOSIS — M79674 Pain in right toe(s): Secondary | ICD-10-CM

## 2023-11-23 DIAGNOSIS — M25511 Pain in right shoulder: Secondary | ICD-10-CM

## 2023-11-23 LAB — POC URINALSYSI DIPSTICK (AUTOMATED)
Bilirubin, UA: NEGATIVE
Blood, UA: NEGATIVE
Glucose, UA: NEGATIVE
Ketones, UA: NEGATIVE
Leukocytes, UA: NEGATIVE
Nitrite, UA: NEGATIVE
Protein, UA: NEGATIVE
Spec Grav, UA: 1.025 (ref 1.010–1.025)
Urobilinogen, UA: 0.2 U/dL
pH, UA: 6 (ref 5.0–8.0)

## 2023-11-23 LAB — PSA: PSA: 0.4 ng/mL (ref 0.10–4.00)

## 2023-11-23 NOTE — Patient Instructions (Addendum)
Attention Deficit Disorder Stable on Vyvanse 50mg  daily. No reported side effects. -Continue Vyvanse 50mg  daily. -Refill prescription on 12/10/2023. Patient to send a message on 12/07/2023 to request refill.   Family history of cancer Father diagnosed with bladder cancer at age 46. Patient concerned about personal risk. -Order PSA and urinalysis today to screen for prostate and bladder cancer.   Colon cancer screening No prior colonoscopy. Family history of cancer. -Refer for colonoscopy.   Tetanus, Diphtheria, Pertussis (Tdap) Vaccination Last vaccination approximately 11 years ago. -Administer Tdap vaccine today.  For rt side trapezius lipoma placed referral to general surgeon.  Lt great toe pain for on month. Bruised one month ago when stubbed toe. Bruise now resolved. On exam no pain on palpation of metatarsal. Faint pain on palpation of toe. Offered xray of toe and he declined today.    General Health Maintenance / Followup Plans -Return in 6 months or sooner if needed. -Consider wellness exam after May 2025 for cholesterol, CBC, and metabolic panel screening.

## 2023-11-23 NOTE — Progress Notes (Signed)
Subjective:    Patient ID: Corey Sosa, male    DOB: May 07, 1976, 48 y.o.   MRN: [id number]  HPI  Discussed the use of AI scribe software for clinical note transcription with the patient, who gave verbal consent to proceed.  History of Present Illness   The patient, a 48 year old individual with a history of ADD, presents for a routine follow-up. He reports continued success with his current medication regimen, which includes Vyvanse 50mg  daily. He denies any side effects from the medication and has been compliant with his treatment plan.  The patient has been experiencing some concerns related to his urinary health. Two years ago, he had leukocytes in his urine, but subsequent workup was negative. He denies any current urinary symptoms and has no history of smoking, which is a risk factor for bladder cancer. Pt has concerns due to his dad history of bladder cancer.  The patient's father was recently diagnosed with bladder cancer, which has raised some concerns for the patient. His father was a smoker and is not in good health. The patient's last PSA was done two years ago and was normal.  The patient has not had a colonoscopy yet, but given his age and family history, he is considering it. He has not had a flu vaccine due to past experiences of getting sick after receiving it. His last Tdap vaccine was approximately 11 years ago, around the time his first child was born.  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 He has not had any issues related to his ADD and the medication continues to help with his concentration.        Pt also has  rt shoulder pain for years. Worse pain with overhead range of motion. Also feels weak.   Rt side trapezius lump since 2019. Recently as gotten larger. Area is tender. Review of Systems See hpi    Objective:   Physical Exam  General Mental Status- Alert.  General Appearance- Not in acute distress.   Skin General: Color- Normal Color. Moisture- Normal Moisture.  Neck Carotid Arteries- Normal color. Moisture- Normal Moisture. No carotid bruits. No JVD.  Chest and Lung Exam Auscultation: Breath Sounds:-Normal.  Cardiovascular Auscultation:Rythm- Regular. Murmurs & Other Heart Sounds:Auscultation of the heart reveals- No Murmurs.  Abdomen Inspection:-Inspeection Normal. Palpation/Percussion:Note:No mass. Palpation and Percussion of the abdomen reveal- Non Tender, Non Distended + BS, no rebound or guarding.  Neurologic Cranial Nerve exam:- CN Sosa-XII intact(No nystagmus), symmetric smile. Drift Test:- No drift.Romberg Exam:- Negative.  Heal to Toe Gait exam:-Normal. Strength:- 5/5 equal and symmetric strength both upper and lower extremities.   Derm- rt side trap moderate size lipoma. Mild tender.  Left great- faint pain on palpation of toe. No bruising.      Rt shoulder- mild pain on rom. Difficulty abducting arm above shoulder level. Assessment & Plan:   Assessment and Plan    Attention Deficit Disorder Stable on Vyvanse 50mg  daily. No reported side effects. -Continue Vyvanse 50mg  daily. -Refill prescription on 12/10/2023. Patient to send a message on 12/07/2023 to request refill.  Family history of cancer Father diagnosed with bladder cancer at age 46. Patient concerned about personal risk. -Order PSA and urinalysis today to screen for prostate and bladder cancer.  Colon cancer screening No prior colonoscopy. Family history of cancer. -Refer for colonoscopy.  Tetanus, Diphtheria, Pertussis (Tdap) Vaccination Last vaccination approximately 11 years ago. -Administer Tdap  vaccine today.  General Health Maintenance / Followup Plans -Return in 6 months or sooner if needed. -Consider wellness exam after May 2025 for cholesterol, CBC, and metabolic panel screening.       Esperanza Richters, PA-C

## 2023-11-26 ENCOUNTER — Ambulatory Visit (INDEPENDENT_AMBULATORY_CARE_PROVIDER_SITE_OTHER): Payer: Medicaid Other

## 2023-11-26 ENCOUNTER — Other Ambulatory Visit (HOSPITAL_BASED_OUTPATIENT_CLINIC_OR_DEPARTMENT_OTHER): Payer: Self-pay

## 2023-11-26 ENCOUNTER — Ambulatory Visit: Payer: Medicaid Other | Admitting: Sports Medicine

## 2023-11-26 VITALS — BP 118/80 | HR 105 | Ht 73.0 in | Wt 157.0 lb

## 2023-11-26 DIAGNOSIS — M25311 Other instability, right shoulder: Secondary | ICD-10-CM

## 2023-11-26 DIAGNOSIS — G8929 Other chronic pain: Secondary | ICD-10-CM | POA: Diagnosis not present

## 2023-11-26 DIAGNOSIS — M25511 Pain in right shoulder: Secondary | ICD-10-CM | POA: Diagnosis not present

## 2023-11-26 MED ORDER — MELOXICAM 15 MG PO TABS
15.0000 mg | ORAL_TABLET | Freq: Every day | ORAL | 0 refills | Status: DC
  Filled 2023-11-26: qty 30, 30d supply, fill #0

## 2023-11-26 NOTE — Progress Notes (Signed)
Corey Sosa D.[facility] [address] [place] [address] Phone: [phone]   Assessment and Plan:     1. Chronic right shoulder pain -Chronic with exacerbation, initial sports medicine visit - Chronic right shoulder pain with patient complaining of instability episodes that sound consistent with subluxations - X-ray today in clinic.  My interpretation: No acute fracture or dislocation. - Start meloxicam 15 mg daily x2 weeks.  If still having pain after 2 weeks, complete 3rd-week of NSAID. May use remaining NSAID as needed once daily for pain control.  Do not to use additional over-the-counter NSAIDs (ibuprofen, naproxen, Advil, Aleve) while taking prescription NSAIDs.  May use Tylenol [phone] mg 2 to 3 times a day for breakthrough pain.  - Start HEP for shoulder and physical therapy to help strengthen rotator cuff and stabilize shoulder  15 additional minutes spent for educating Therapeutic Home Exercise Program.  This included exercises focusing on stretching, strengthening, with focus on eccentric aspects.   Long term goals include an improvement in range of motion, strength, endurance as well as avoiding reinjury. Patient's frequency would include in 1-2 times a day, 3-5 times a week for a duration of 6-12 weeks. Proper technique shown and discussed handout in great detail with ATC.  All questions were discussed and answered.     Pertinent previous records reviewed include none  Follow Up: 4 weeks for reevaluation.  Could consider CSI versus ultrasound versus advanced imaging   Subjective:   I, Corey Sosa, am serving as a Neurosurgeon for Doctor Richardean Sale  Chief Complaint: right shoulder pain   HPI:   11/26/23 Patient is a 48 year old male with right shoulder pain. Patient states hx of an injury in college. 2-3 years ago he had a work injury. He does play volleyball and Softball. He does a lot of overhead work. He states he had  instances of his shoulder popping out 5-6 times over the lst 2 years. He notes decreased ROM due to pain, or subluxation. He isnt able to sleep through the night. No radiating pain. Occasional numbness and tingling. Ibu for the pain and or meloxicam from his wife. No decrease in grip strength   Relevant Historical Information: None pertinent  Additional pertinent review of systems negative.   Current Outpatient Medications:    buPROPion (WELLBUTRIN XL) 150 MG 24 hr tablet, Take 1 tablet (150 mg total) by mouth daily., Disp: 30 tablet, Rfl: 0   lisdexamfetamine (VYVANSE) 50 MG capsule, Take 1 capsule (50 mg total) by mouth daily., Disp: 30 capsule, Rfl: 0   meloxicam (MOBIC) 15 MG tablet, Take 1 tablet (15 mg total) by mouth daily., Disp: 30 tablet, Rfl: 0   Objective:     Vitals:   11/26/23 1453  BP: 118/80  Pulse: (!) 105  SpO2: 99%  Weight: 157 lb (71.2 kg)  Height: 6\' 1"  (1.854 m)      Body mass index is 20.71 kg/m.    Physical Exam:    Gen: Appears well, nad, nontoxic and pleasant Neuro:sensation intact, strength is 5/5 with df/pf/inv/ev, muscle tone wnl Skin: no suspicious lesion or defmority Psych: A&O, appropriate mood and affect  Right shoulder:  No deformity, swelling or muscle wasting No scapular winging FF 180, abd 180, int 0, ext 90 NTTP over the Raymond, clavicle, ac, coracoid, biceps groove, humerus, deltoid, trapezius, cervical spine Mildly painful empty can, Hawkins Neg neer,  obriens, crossarm, subscap liftoff, speeds Positive ant drawer,  apprehension  Negative sulcus sign Negative Spurling's test bilat FROM of neck    Electronically signed by:  Corey Sosa D.[facility] 3:31 PM 11/26/23

## 2023-11-26 NOTE — Patient Instructions (Signed)
-   Start meloxicam 15 mg daily x2 weeks.  If still having pain after 2 weeks, complete 3rd-week of NSAID. May use remaining NSAID as needed once daily for pain control.  Do not to use additional over-the-counter NSAIDs (ibuprofen, naproxen, Advil, Aleve) while taking prescription NSAIDs.  May use Tylenol [phone] mg 2 to 3 times a day for breakthrough pain. Shoulder  HEP  PT referral  4 week follow up

## 2023-11-27 ENCOUNTER — Encounter: Payer: Self-pay | Admitting: Sports Medicine

## 2023-11-30 ENCOUNTER — Telehealth: Payer: Self-pay | Admitting: Sports Medicine

## 2023-11-30 NOTE — Telephone Encounter (Signed)
Per fax from [facility] in [place], they cannot see pt as insurance is out of network.

## 2023-12-01 NOTE — Addendum Note (Signed)
Addended by: Evon Slack on: 12/01/2023 07:50 AM   Modules accepted: Orders

## 2023-12-11 ENCOUNTER — Other Ambulatory Visit: Payer: Self-pay | Admitting: Family

## 2023-12-15 NOTE — Telephone Encounter (Signed)
 Pt needs refill sent downstairs

## 2023-12-15 NOTE — Telephone Encounter (Signed)
 Requesting: vyvanse Contract:05/29/23 UDS:05/21/23 Last Visit:11/23/23 Next Visit:n/a Last Refill:11/09/23  Please Advise

## 2023-12-16 ENCOUNTER — Other Ambulatory Visit (HOSPITAL_BASED_OUTPATIENT_CLINIC_OR_DEPARTMENT_OTHER): Payer: Self-pay

## 2023-12-16 MED ORDER — LISDEXAMFETAMINE DIMESYLATE 50 MG PO CAPS
50.0000 mg | ORAL_CAPSULE | Freq: Every day | ORAL | 0 refills | Status: DC
  Filled 2023-12-16: qty 30, 30d supply, fill #0

## 2023-12-16 NOTE — Telephone Encounter (Signed)
Refill of vyvanse sent to pharmacy.

## 2023-12-17 ENCOUNTER — Other Ambulatory Visit (HOSPITAL_BASED_OUTPATIENT_CLINIC_OR_DEPARTMENT_OTHER): Payer: Self-pay

## 2023-12-22 ENCOUNTER — Other Ambulatory Visit: Payer: Self-pay | Admitting: Surgery

## 2023-12-23 NOTE — Progress Notes (Deleted)
    Aleen Sells D.[facility] [address] [place] [address] Phone: [phone]   Assessment and Plan:     There are no diagnoses linked to this encounter.  ***   Pertinent previous records reviewed include ***    Follow Up: ***     Subjective:   I, Cassi Jenne, am serving as a Neurosurgeon for Doctor Richardean Sale   Chief Complaint: right shoulder pain    HPI:    11/26/23 Patient is a 48 year old male with right shoulder pain. Patient states hx of an injury in college. 2-3 years ago he had a work injury. He does play volleyball and Softball. He does a lot of overhead work. He states he had instances of his shoulder popping out 5-6 times over the lst 2 years. He notes decreased ROM due to pain, or subluxation. He isnt able to sleep through the night. No radiating pain. Occasional numbness and tingling. Ibu for the pain and or meloxicam from his wife. No decrease in grip strength   12/24/2023 Patient states   Relevant Historical Information: None pertinent  Additional pertinent review of systems negative.   Current Outpatient Medications:    buPROPion (WELLBUTRIN XL) 150 MG 24 hr tablet, Take 1 tablet (150 mg total) by mouth daily., Disp: 30 tablet, Rfl: 0   lisdexamfetamine (VYVANSE) 50 MG capsule, Take 1 capsule (50 mg total) by mouth daily., Disp: 30 capsule, Rfl: 0   meloxicam (MOBIC) 15 MG tablet, Take 1 tablet (15 mg total) by mouth daily., Disp: 30 tablet, Rfl: 0   Objective:     There were no vitals filed for this visit.    There is no height or weight on file to calculate BMI.    Physical Exam:    ***   Electronically signed by:  Aleen Sells [facility] 7:36 AM 12/23/23

## 2023-12-24 ENCOUNTER — Ambulatory Visit: Payer: Medicaid Other | Admitting: Sports Medicine

## 2024-01-11 ENCOUNTER — Other Ambulatory Visit: Payer: Self-pay

## 2024-01-11 ENCOUNTER — Encounter (HOSPITAL_BASED_OUTPATIENT_CLINIC_OR_DEPARTMENT_OTHER): Payer: Self-pay | Admitting: Surgery

## 2024-01-14 ENCOUNTER — Other Ambulatory Visit: Payer: Self-pay | Admitting: Sports Medicine

## 2024-01-14 ENCOUNTER — Other Ambulatory Visit (HOSPITAL_BASED_OUTPATIENT_CLINIC_OR_DEPARTMENT_OTHER): Payer: Self-pay

## 2024-01-14 ENCOUNTER — Other Ambulatory Visit: Payer: Self-pay | Admitting: Medical

## 2024-01-14 NOTE — Telephone Encounter (Signed)
 Requesting: vyvanse Contract:05/29/23 UDS:05/21/23 Last Visit:11/23/23 Next Visit:n/a Last Refill:12/16/23  Please Advise

## 2024-01-15 ENCOUNTER — Ambulatory Visit (HOSPITAL_BASED_OUTPATIENT_CLINIC_OR_DEPARTMENT_OTHER): Admitting: Anesthesiology

## 2024-01-15 ENCOUNTER — Other Ambulatory Visit: Payer: Self-pay

## 2024-01-15 ENCOUNTER — Encounter (HOSPITAL_BASED_OUTPATIENT_CLINIC_OR_DEPARTMENT_OTHER): Payer: Self-pay | Admitting: Surgery

## 2024-01-15 ENCOUNTER — Ambulatory Visit (HOSPITAL_BASED_OUTPATIENT_CLINIC_OR_DEPARTMENT_OTHER)
Admission: RE | Admit: 2024-01-15 | Discharge: 2024-01-15 | Disposition: A | Discharge status: Home / Self Care | Payer: Medicaid Other | Source: Ambulatory Visit | Attending: Surgery | Admitting: Surgery

## 2024-01-15 ENCOUNTER — Other Ambulatory Visit (HOSPITAL_BASED_OUTPATIENT_CLINIC_OR_DEPARTMENT_OTHER): Payer: Self-pay

## 2024-01-15 ENCOUNTER — Encounter (HOSPITAL_BASED_OUTPATIENT_CLINIC_OR_DEPARTMENT_OTHER)
Admission: RE | Disposition: A | Discharge status: Home / Self Care | Payer: Self-pay | Source: Ambulatory Visit | Attending: Surgery

## 2024-01-15 DIAGNOSIS — R221 Localized swelling, mass and lump, neck: Secondary | ICD-10-CM | POA: Diagnosis present

## 2024-01-15 DIAGNOSIS — D170 Benign lipomatous neoplasm of skin and subcutaneous tissue of head, face and neck: Secondary | ICD-10-CM | POA: Insufficient documentation

## 2024-01-15 HISTORY — DX: Anxiety disorder, unspecified: F41.9

## 2024-01-15 HISTORY — DX: Attention-deficit hyperactivity disorder, unspecified type: F90.9

## 2024-01-15 HISTORY — DX: Unspecified osteoarthritis, unspecified site: M19.90

## 2024-01-15 HISTORY — PX: EXCISION MASS NECK: SHX6703

## 2024-01-15 SURGERY — EXCISION, MASS, NECK
Anesthesia: Monitor Anesthesia Care | Site: Neck | Laterality: Right

## 2024-01-15 MED ORDER — ACETAMINOPHEN 10 MG/ML IV SOLN: 1000.0000 mg | Freq: Once | INTRAVENOUS | Status: DC | PRN

## 2024-01-15 MED ORDER — OXYCODONE HCL 5 MG/5ML PO SOLN: 5.0000 mg | Freq: Once | ORAL | Status: DC | PRN

## 2024-01-15 MED ORDER — CHLORHEXIDINE GLUCONATE CLOTH 2 % EX PADS: 6.0000 | MEDICATED_PAD | Freq: Once | CUTANEOUS | Status: DC

## 2024-01-15 MED ORDER — MIDAZOLAM HCL 2 MG/2ML IJ SOLN
INTRAMUSCULAR | Status: DC | PRN
  Administered 2024-01-15: 2 mg via INTRAVENOUS

## 2024-01-15 MED ORDER — LIDOCAINE HCL (PF) 1 % IJ SOLN
INTRAMUSCULAR | Status: DC | PRN
  Administered 2024-01-15: 20 mL

## 2024-01-15 MED ORDER — ACETAMINOPHEN 500 MG PO TABS
ORAL_TABLET | ORAL | Status: AC
  Filled 2024-01-15: qty 2

## 2024-01-15 MED ORDER — FENTANYL CITRATE (PF) 100 MCG/2ML IJ SOLN
INTRAMUSCULAR | Status: AC
  Filled 2024-01-15: qty 2

## 2024-01-15 MED ORDER — ACETAMINOPHEN 500 MG PO TABS: 1000.0000 mg | ORAL_TABLET | Freq: Once | ORAL | Status: DC | PRN

## 2024-01-15 MED ORDER — ONDANSETRON HCL 4 MG/2ML IJ SOLN
INTRAMUSCULAR | Status: DC | PRN
  Administered 2024-01-15: 4 mg via INTRAVENOUS

## 2024-01-15 MED ORDER — SODIUM BICARBONATE 4.2 % IV SOLN
INTRAVENOUS | Status: AC
  Filled 2024-01-15: qty 10

## 2024-01-15 MED ORDER — LIDOCAINE HCL (PF) 1 % IJ SOLN
INTRAMUSCULAR | Status: AC
  Filled 2024-01-15: qty 30

## 2024-01-15 MED ORDER — CEFAZOLIN SODIUM-DEXTROSE 2-4 GM/100ML-% IV SOLN
2.0000 g | INTRAVENOUS | Status: AC
  Administered 2024-01-15: 2 g via INTRAVENOUS

## 2024-01-15 MED ORDER — OXYCODONE HCL 5 MG PO TABS: 5.0000 mg | ORAL_TABLET | Freq: Once | ORAL | Status: DC | PRN

## 2024-01-15 MED ORDER — ONDANSETRON HCL 4 MG/2ML IJ SOLN
INTRAMUSCULAR | Status: AC
  Filled 2024-01-15: qty 2

## 2024-01-15 MED ORDER — DEXAMETHASONE SODIUM PHOSPHATE 10 MG/ML IJ SOLN
INTRAMUSCULAR | Status: AC
  Filled 2024-01-15: qty 1

## 2024-01-15 MED ORDER — ACETAMINOPHEN 160 MG/5ML PO SOLN: 1000.0000 mg | Freq: Once | ORAL | Status: DC | PRN

## 2024-01-15 MED ORDER — LISDEXAMFETAMINE DIMESYLATE 50 MG PO CAPS
50.0000 mg | ORAL_CAPSULE | Freq: Every day | ORAL | 0 refills | Status: DC
  Filled 2024-01-15: qty 30, 30d supply, fill #0

## 2024-01-15 MED ORDER — CEFAZOLIN SODIUM-DEXTROSE 2-4 GM/100ML-% IV SOLN
INTRAVENOUS | Status: AC
  Filled 2024-01-15: qty 100

## 2024-01-15 MED ORDER — PROPOFOL 500 MG/50ML IV EMUL
INTRAVENOUS | Status: DC | PRN
  Administered 2024-01-15: 200 ug/kg/min via INTRAVENOUS

## 2024-01-15 MED ORDER — TRAMADOL HCL 50 MG PO TABS
50.0000 mg | ORAL_TABLET | Freq: Four times a day (QID) | ORAL | 0 refills | Status: DC | PRN
  Filled 2024-01-15: qty 20, 5d supply, fill #0

## 2024-01-15 MED ORDER — FENTANYL CITRATE (PF) 100 MCG/2ML IJ SOLN
INTRAMUSCULAR | Status: DC | PRN
  Administered 2024-01-15: 100 ug via INTRAVENOUS

## 2024-01-15 MED ORDER — FENTANYL CITRATE (PF) 100 MCG/2ML IJ SOLN: 25.0000 ug | INTRAMUSCULAR | Status: DC | PRN

## 2024-01-15 MED ORDER — LACTATED RINGERS IV SOLN: INTRAVENOUS | Status: DC

## 2024-01-15 MED ORDER — MIDAZOLAM HCL 2 MG/2ML IJ SOLN
INTRAMUSCULAR | Status: AC
  Filled 2024-01-15: qty 2

## 2024-01-15 MED ORDER — ACETAMINOPHEN 500 MG PO TABS: 1000.0000 mg | ORAL_TABLET | ORAL | Status: DC

## 2024-01-15 SURGICAL SUPPLY — 38 items
BLADE CLIPPER SURG (BLADE) IMPLANT
BLADE SURG 15 STRL LF DISP TIS (BLADE) ×1 IMPLANT
BNDG ELASTIC 3INX 5YD STR LF (GAUZE/BANDAGES/DRESSINGS) IMPLANT
BNDG ELASTIC 4INX 5YD STR LF (GAUZE/BANDAGES/DRESSINGS) IMPLANT
CANISTER SUCT 1200ML W/VALVE (MISCELLANEOUS) IMPLANT
CHLORAPREP W/TINT 26 (MISCELLANEOUS) ×1 IMPLANT
COVER BACK TABLE 60X90IN (DRAPES) ×1 IMPLANT
COVER MAYO STAND STRL (DRAPES) ×1 IMPLANT
DERMABOND ADVANCED .7 DNX12 (GAUZE/BANDAGES/DRESSINGS) ×1 IMPLANT
DRAPE LAPAROTOMY 100X72 PEDS (DRAPES) ×1 IMPLANT
DRAPE UTILITY XL STRL (DRAPES) ×1 IMPLANT
ELECT REM PT RETURN 9FT ADLT (ELECTROSURGICAL) ×1 IMPLANT
ELECTRODE REM PT RTRN 9FT ADLT (ELECTROSURGICAL) ×1 IMPLANT
GLOVE BIOGEL PI IND STRL 7.0 (GLOVE) IMPLANT
GLOVE BIOGEL PI IND STRL 7.5 (GLOVE) IMPLANT
GLOVE SURG SIGNA 7.5 PF LTX (GLOVE) ×1 IMPLANT
GLOVE SURG SYN 7.5 E (GLOVE) ×1 IMPLANT
GLOVE SURG SYN 7.5 PF PI (GLOVE) IMPLANT
GOWN STRL REUS W/ TWL LRG LVL3 (GOWN DISPOSABLE) ×1 IMPLANT
GOWN STRL REUS W/ TWL XL LVL3 (GOWN DISPOSABLE) ×1 IMPLANT
NDL HYPO 22X1.5 SAFETY MO (MISCELLANEOUS) IMPLANT
NDL HYPO 25X1 1.5 SAFETY (NEEDLE) ×1 IMPLANT
NEEDLE HYPO 22X1.5 SAFETY MO (MISCELLANEOUS) ×1 IMPLANT
NEEDLE HYPO 25X1 1.5 SAFETY (NEEDLE) IMPLANT
NS IRRIG 1000ML POUR BTL (IV SOLUTION) IMPLANT
PACK BASIN DAY SURGERY FS (CUSTOM PROCEDURE TRAY) ×1 IMPLANT
PENCIL SMOKE EVACUATOR (MISCELLANEOUS) ×1 IMPLANT
SLEEVE SCD COMPRESS KNEE MED (STOCKING) IMPLANT
SPIKE FLUID TRANSFER (MISCELLANEOUS) IMPLANT
SPONGE T-LAP 4X18 ~~LOC~~+RFID (SPONGE) ×1 IMPLANT
SUT MNCRL AB 4-0 PS2 18 (SUTURE) IMPLANT
SUT VIC AB 2-0 SH 27XBRD (SUTURE) IMPLANT
SUT VIC AB 3-0 SH 27X BRD (SUTURE) IMPLANT
SYR BULB EAR ULCER 3OZ GRN STR (SYRINGE) IMPLANT
SYR CONTROL 10ML LL (SYRINGE) ×1 IMPLANT
TOWEL GREEN STERILE FF (TOWEL DISPOSABLE) ×1 IMPLANT
TUBE CONNECTING 20X1/4 (TUBING) IMPLANT
YANKAUER SUCT BULB TIP NO VENT (SUCTIONS) IMPLANT

## 2024-01-15 NOTE — Anesthesia Preprocedure Evaluation (Signed)
 Anesthesia Evaluation  Patient identified by MRN, date of birth, ID band Patient awake    Reviewed: Allergy & Precautions, NPO status , Patient's Chart, lab work & pertinent test results  History of Anesthesia Complications Negative for: history of anesthetic complications  Airway Mallampati: I  TM Distance: >3 FB Neck ROM: Full    Dental  (+) Teeth Intact, Dental Advisory Given   Pulmonary neg pulmonary ROS   breath sounds clear to auscultation       Cardiovascular negative cardio ROS  Rhythm:Regular     Neuro/Psych  PSYCHIATRIC DISORDERS Anxiety     negative neurological ROS     GI/Hepatic negative GI ROS, Neg liver ROS,,,  Endo/Other  negative endocrine ROS    Renal/GU negative Renal ROS     Musculoskeletal  (+) Arthritis ,    Abdominal   Peds  Hematology negative hematology ROS (+)   Anesthesia Other Findings   Reproductive/Obstetrics                             Anesthesia Physical Anesthesia Plan  ASA: 2  Anesthesia Plan: MAC   Post-op Pain Management:    Induction: Intravenous  PONV Risk Score and Plan: 2 and Ondansetron, Propofol infusion and Treatment may vary due to age or medical condition  Airway Management Planned: Nasal Cannula, Natural Airway and Simple Face Mask  Additional Equipment: None  Intra-op Plan:   Post-operative Plan:   Informed Consent: I have reviewed the patients History and Physical, chart, labs and discussed the procedure including the risks, benefits and alternatives for the proposed anesthesia with the patient or authorized representative who has indicated his/her understanding and acceptance.     Dental advisory given  Plan Discussed with: CRNA  Anesthesia Plan Comments:        Anesthesia Quick Evaluation

## 2024-01-15 NOTE — Transfer of Care (Signed)
 Immediate Anesthesia Transfer of Care Note  Patient: Corey Sosa  Procedure(s) Performed: EXCISION POSTERIOR NECK MASS (Right: Neck)  Patient Location: PACU  Anesthesia Type:MAC  Level of Consciousness: awake, drowsy, and patient cooperative  Airway & Oxygen Therapy: Patient Spontanous Breathing and Patient connected to face mask oxygen  Post-op Assessment: Report given to RN and Post -op Vital signs reviewed and stable  Post vital signs: Reviewed and stable  Last Vitals:  Vitals Value Taken Time  BP 104/78 01/15/24 1300  Temp    Pulse 69 01/15/24 1302  Resp 11 01/15/24 1302  SpO2 98 % 01/15/24 1302  Vitals shown include unfiled device data.  Last Pain:  Vitals:   01/15/24 1138  TempSrc: Temporal  PainSc: 0-No pain      Patients Stated Pain Goal: 1 (01/15/24 1138)  Complications: No notable events documented.

## 2024-01-15 NOTE — H&P (Signed)
 REFERRING PHYSICIAN: Saguier, Bayard Beaver, PA PROVIDER: Wayne Both, MD MRN: [id number] DOB: 17-Sep-1976  Subjective   Chief Complaint: New Consultation (Lipoma on back on neck)  History of Present Illness: Corey Sosa is a 48 y.o. male who is seen  as an office consultation for evaluation of New Consultation (Lipoma on back on neck)  This is a pleasant 48 year old gentleman referred here for a posterior neck mass. He reports that the mass has been present for several years but is now getting larger and causing significant pain especially with turning his head. It will occasionally appear slightly erythematous. He has never had any drainage from this denies any trauma to the area. He is otherwise healthy without complaints  Review of Systems: A complete review of systems was obtained from the patient. I have reviewed this information and discussed as appropriate with the patient. See HPI as well for other ROS.  ROS   Medical History: History reviewed. No pertinent past medical history.  There is no problem list on file for this patient.  History reviewed. No pertinent surgical history.   No Known Allergies  Current Outpatient Medications on File Prior to Visit  Medication Sig Dispense Refill  buPROPion (WELLBUTRIN XL) 150 MG XL tablet Take 1 tablet by mouth once daily  lisdexamfetamine (VYVANSE) 50 MG capsule Take 50 mg by mouth once daily  meloxicam (MOBIC) 15 MG tablet Take 1 tablet by mouth once daily   No current facility-administered medications on file prior to visit.   Family History  Problem Relation Age of Onset  Breast cancer Mother  Stroke Father  High blood pressure (Hypertension) Father  Coronary Artery Disease (Blocked arteries around heart) Father    Social History   Tobacco Use  Smoking Status Never  Smokeless Tobacco Never    Social History   Socioeconomic History  Marital status: Married  Tobacco Use  Smoking status: Never  Smokeless  tobacco: Never  Vaping Use  Vaping status: Never Used  Substance and Sexual Activity  Alcohol use: Not Currently  Comment: 2 beers a month  Drug use: Never   Social Drivers of Corporate investment banker Strain: Low Risk (11/22/2023)  Received from [facility]  Overall Financial Resource Strain (CARDIA)  Difficulty of Paying Living Expenses: Not very hard  Food Insecurity: No Food Insecurity (11/22/2023)  Received from [facility]  Hunger Vital Sign  Worried About Running Out of Food in the Last Year: Never true  Ran Out of Food in the Last Year: Never true  Transportation Needs: No Transportation Needs (11/22/2023)  Received from [facility] - Transportation  Lack of Transportation (Medical): No  Lack of Transportation (Non-Medical): No  Physical Activity: Sufficiently Active (11/22/2023)  Received from [facility]  Exercise Vital Sign  Days of Exercise per Week: 5 days  Minutes of Exercise per Session: 60 min  Stress: No Stress Concern Present (11/22/2023)  Received from [facility] - Occupational Stress Questionnaire  Feeling of Stress : Not at all  Social Connections: Unknown (11/22/2023)  Received from [facility]  Social Connection and Isolation Panel [NHANES]  Marital Status: Married  Housing Stability: High Risk (11/22/2023)  Received from [facility]  Housing Stability Vital Sign  Unable to Pay for Housing in the Last Year: Yes  Number of Times Moved in the Last Year: 0  Homeless in the Last Year: No   Objective:   Vitals:   BP: 116/82  Pulse:  110  Temp: 36.7 C (98 F)  SpO2: 98%  Weight: 74.7 kg (164 lb 9.6 oz)  Height: 182.9 cm (6')  PainSc: 2  PainLoc: Neck   Body mass index is 22.32 kg/m.  Physical Exam   He appears well on exam  There is a 4 cm mass at the posterior neck near the base approaching the back at the midline. There is very slight skin discoloration. It is minimally tender. It is  nonpulsatile.  There is no cervical adenopathy  Labs, Imaging and Diagnostic Testing: I have reviewed the notes in the electronic medical records  Assessment and Plan:   Diagnoses and all orders for this visit:  Neck mass   This is a 4 cm posterior neck mass of uncertain etiology. It is difficult to tell whether this may be a lipoma or a sebaceous cyst. It is not firm enough to be a lymph node but I cannot rule out some type of desmoid tumor. As it is symptomatic and getting larger, surgical excision is recommended for complete histologic evaluation of the mass to rule out a malignancy. I explained the surgical procedure in detail. We discussed the risks which includes but is not limited to bleeding, infection, recurrence of the mass, injury to surrounding structures, postoperative recovery, cardiopulmonary issues with anesthesia, etc. He understands and wishes to proceed with surgery which will be scheduled

## 2024-01-15 NOTE — Interval H&P Note (Signed)
 History and Physical Interval Note:no change in H and P  01/15/2024 12:23 PM  Corey Sosa  has presented today for surgery, with the diagnosis of posterior neck mass.  The various methods of treatment have been discussed with the patient and family. After consideration of risks, benefits and other options for treatment, the patient has consented to  Procedure(s): EXCISION POSTERIOR NECK MASS (N/A) as a surgical intervention.  The patient's history has been reviewed, patient examined, no change in status, stable for surgery.  I have reviewed the patient's chart and labs.  Questions were answered to the patient's satisfaction.     Abigail Miyamoto

## 2024-01-15 NOTE — Discharge Instructions (Addendum)
 You may shower starting tomorrow  No vigorous activity for 1 week  Ice pack, Tylenol, and ibuprofen also for pain   Post Anesthesia Home Care Instructions  Activity: Get plenty of rest for the remainder of the day. A responsible individual must stay with you for 24 hours following the procedure.  For the next 24 hours, DO NOT: -Drive a car -Advertising copywriter -Drink alcoholic beverages -Take any medication unless instructed by your physician -Make any legal decisions or sign important papers.  Meals: Start with liquid foods such as gelatin or soup. Progress to regular foods as tolerated. Avoid greasy, spicy, heavy foods. If nausea and/or vomiting occur, drink only clear liquids until the nausea and/or vomiting subsides. Call your physician if vomiting continues.  Special Instructions/Symptoms: Your throat may feel dry or sore from the anesthesia or the breathing tube placed in your throat during surgery. If this causes discomfort, gargle with warm salt water. The discomfort should disappear within 24 hours.  If you had a scopolamine patch placed behind your ear for the management of post- operative nausea and/or vomiting:  1. The medication in the patch is effective for 72 hours, after which it should be removed.  Wrap patch in a tissue and discard in the trash. Wash hands thoroughly with soap and water. 2. You may remove the patch earlier than 72 hours if you experience unpleasant side effects which may include dry mouth, dizziness or visual disturbances. 3. Avoid touching the patch. Wash your hands with soap and water after contact with the patch.  No tylenol untll after 5:45 today if needed.

## 2024-01-15 NOTE — Op Note (Signed)
 EXCISION POSTERIOR NECK MASS  Procedure Note  Corey Sosa 01/15/2024   Pre-op Diagnosis: posterior neck mass (4 cm)     Post-op Diagnosis: same  Procedure(s): EXCISION POSTERIOR NECK MASS (4 cm)  Surgeon(s): Abigail Miyamoto, MD  Anesthesia: Monitor Anesthesia Care  Staff:  Circulator: Lenn Cal, RN Scrub Person: Mickeal Needy Circulator Assistant: Griffin Basil, RN  Estimated Blood Loss: Minimal               Specimens: sent to path  Findings: The 4 cm posterior neck mass had the appearance consistent with a lipoma  Procedure: The patient was brought to the operating room and identified the correct patient.  He was placed upon on the operating room table and then turned to the left lateral decubitus position.  Anesthesia was then induced.  His neck was then prepped and draped in the usual sterile fashion.  The mass was located on the posterior neck just to the right of the midline.  I anesthetized the overlying skin with lidocaine and then made incision with a scalpel.  I dissected down through the deep dermal tissue and then identified the mass which appeared consistent with a lipoma.  I excised it in its entirety with the electrocautery.  It was approximately 4 cm in size.  I achieved hemostasis with the cautery.  I injected further lidocaine into the incision.  I then closed the subcutaneous tissue with interrupted 3-0 Vicryl sutures and closed the skin with a running 4-0 Monocryl.  Dermabond was then applied.  The patient tolerated the procedure well.  All the counts were correct at the end of the procedure.  The patient was then taken in a stable condition from the operating room to the recovery room.          Abigail Miyamoto   Date: 01/15/2024  Time: 12:58 PM

## 2024-01-15 NOTE — Anesthesia Postprocedure Evaluation (Signed)
 Anesthesia Post Note  Patient: Corey Sosa  Procedure(s) Performed: EXCISION POSTERIOR NECK MASS (Right: Neck)     Patient location during evaluation: PACU Anesthesia Type: MAC Level of consciousness: awake and alert Pain management: pain level controlled Vital Signs Assessment: post-procedure vital signs reviewed and stable Respiratory status: spontaneous breathing, nonlabored ventilation and respiratory function stable Cardiovascular status: stable and blood pressure returned to baseline Postop Assessment: no apparent nausea or vomiting Anesthetic complications: no   No notable events documented.  Last Vitals:  Vitals:   01/15/24 1315 01/15/24 1333  BP: 110/89 (!) 121/96  Pulse: 82 73  Resp: 15 14  Temp:  (!) 36.3 C  SpO2: 98% 97%    Last Pain:  Vitals:   01/15/24 1333  TempSrc:   PainSc: 0-No pain                 Bertel Venard

## 2024-01-15 NOTE — Telephone Encounter (Signed)
Rx refill sent to pt pharmacy 

## 2024-01-16 ENCOUNTER — Encounter (HOSPITAL_BASED_OUTPATIENT_CLINIC_OR_DEPARTMENT_OTHER): Payer: Self-pay | Admitting: Surgery

## 2024-01-18 LAB — SURGICAL PATHOLOGY

## 2024-02-15 ENCOUNTER — Other Ambulatory Visit: Payer: Self-pay | Admitting: Medical

## 2024-02-15 ENCOUNTER — Other Ambulatory Visit: Payer: Self-pay | Admitting: Sports Medicine

## 2024-02-15 NOTE — Telephone Encounter (Signed)
 Requesting: Vyvnase 50mg   Contract:  05/21/23 UDS: 05/21/23 Last Visit: 11/23/23 Next Visit: None Last Refill: 01/15/24 #30 and 0RF   Please Advise

## 2024-02-16 ENCOUNTER — Other Ambulatory Visit (HOSPITAL_BASED_OUTPATIENT_CLINIC_OR_DEPARTMENT_OTHER): Payer: Self-pay

## 2024-02-17 ENCOUNTER — Other Ambulatory Visit: Payer: Self-pay | Admitting: Medical

## 2024-02-17 ENCOUNTER — Other Ambulatory Visit (HOSPITAL_BASED_OUTPATIENT_CLINIC_OR_DEPARTMENT_OTHER): Payer: Self-pay

## 2024-02-17 NOTE — Telephone Encounter (Signed)
 Requesting: Vyvanse  50mg  Contract: 05/21/2023 UDS: 05/21/2023 Last Visit: 11/23/2023 Next Visit: N/A Last Refill: 01/15/2024  Please Advise

## 2024-02-22 ENCOUNTER — Other Ambulatory Visit (HOSPITAL_BASED_OUTPATIENT_CLINIC_OR_DEPARTMENT_OTHER): Payer: Self-pay

## 2024-02-22 MED ORDER — LISDEXAMFETAMINE DIMESYLATE 50 MG PO CAPS
50.0000 mg | ORAL_CAPSULE | Freq: Every day | ORAL | 0 refills | Status: DC
  Filled 2024-02-22: qty 30, 30d supply, fill #0

## 2024-02-22 NOTE — Telephone Encounter (Signed)
 Rx refill sent to pharmacy.

## 2024-03-24 ENCOUNTER — Other Ambulatory Visit: Payer: Self-pay | Admitting: Sports Medicine

## 2024-03-24 ENCOUNTER — Other Ambulatory Visit: Payer: Self-pay | Admitting: Medical

## 2024-03-24 ENCOUNTER — Other Ambulatory Visit (HOSPITAL_BASED_OUTPATIENT_CLINIC_OR_DEPARTMENT_OTHER): Payer: Self-pay

## 2024-03-24 ENCOUNTER — Other Ambulatory Visit: Payer: Self-pay

## 2024-03-24 MED ORDER — LISDEXAMFETAMINE DIMESYLATE 50 MG PO CAPS
50.0000 mg | ORAL_CAPSULE | Freq: Every day | ORAL | 0 refills | Status: DC
  Filled 2024-03-24: qty 30, 30d supply, fill #0

## 2024-03-24 NOTE — Telephone Encounter (Signed)
Rx refill sent to pt pharmacy 

## 2024-03-24 NOTE — Telephone Encounter (Signed)
 Requesting: Vyvanse  50mg  Contract: 05/21/23 UDS: 05/21/23 Last Visit: 11/23/23 Next Visit: None Last Refill: 02/22/24 #30 and 0RF   Please Advise

## 2024-03-30 ENCOUNTER — Other Ambulatory Visit: Payer: Self-pay | Admitting: Medical

## 2024-03-30 ENCOUNTER — Other Ambulatory Visit (HOSPITAL_BASED_OUTPATIENT_CLINIC_OR_DEPARTMENT_OTHER): Payer: Self-pay

## 2024-03-30 MED ORDER — MELOXICAM 15 MG PO TABS
15.0000 mg | ORAL_TABLET | Freq: Every day | ORAL | 0 refills | Status: DC
  Filled 2024-03-30: qty 30, 30d supply, fill #0

## 2024-03-30 NOTE — Telephone Encounter (Signed)
 Rx meloxicam  sent. Pt is due for controlled med follow up. Please get him scheduled for that. Also can do wellness exam on same day. Recommend early morning exam so can come in fasting.

## 2024-03-31 ENCOUNTER — Other Ambulatory Visit (HOSPITAL_BASED_OUTPATIENT_CLINIC_OR_DEPARTMENT_OTHER): Payer: Self-pay

## 2024-05-01 ENCOUNTER — Other Ambulatory Visit: Payer: Self-pay | Admitting: Medical

## 2024-05-02 NOTE — Telephone Encounter (Signed)
 Requesting: vyvanse  Contract:11/23/23 UDS:05/21/23 Last Visit:11/23/23 Next Visit:n/a Last Refill:03/24/24  Please Advise

## 2024-05-04 ENCOUNTER — Other Ambulatory Visit (HOSPITAL_BASED_OUTPATIENT_CLINIC_OR_DEPARTMENT_OTHER): Payer: Self-pay

## 2024-05-04 MED ORDER — LISDEXAMFETAMINE DIMESYLATE 50 MG PO CAPS
50.0000 mg | ORAL_CAPSULE | Freq: Every day | ORAL | 0 refills | Status: DC
  Filled 2024-05-04: qty 30, 30d supply, fill #0

## 2024-05-04 NOTE — Telephone Encounter (Signed)
 Rx sent in. Please get pt scheduled for controlled med visit by end of this month.

## 2024-06-02 ENCOUNTER — Other Ambulatory Visit (HOSPITAL_BASED_OUTPATIENT_CLINIC_OR_DEPARTMENT_OTHER): Payer: Self-pay

## 2024-06-02 ENCOUNTER — Other Ambulatory Visit: Admitting: Medical

## 2024-06-02 ENCOUNTER — Other Ambulatory Visit: Payer: Self-pay | Admitting: Medical

## 2024-06-02 MED ORDER — MELOXICAM 15 MG PO TABS
15.0000 mg | ORAL_TABLET | Freq: Every day | ORAL | 0 refills | Status: DC
  Filled 2024-06-02: qty 30, 30d supply, fill #0

## 2024-06-03 MED ORDER — LISDEXAMFETAMINE DIMESYLATE 50 MG PO CAPS
50.0000 mg | ORAL_CAPSULE | Freq: Every day | ORAL | 0 refills | Status: DC
  Filled 2024-06-03: qty 30, 30d supply, fill #0

## 2024-06-03 NOTE — Telephone Encounter (Signed)
 Rx refill sent to pharmacy. He has to get scheduled for follow up this month. He is just overdue for follow up.

## 2024-06-05 ENCOUNTER — Other Ambulatory Visit (HOSPITAL_BASED_OUTPATIENT_CLINIC_OR_DEPARTMENT_OTHER): Payer: Self-pay

## 2024-06-06 ENCOUNTER — Other Ambulatory Visit (HOSPITAL_BASED_OUTPATIENT_CLINIC_OR_DEPARTMENT_OTHER): Payer: Self-pay

## 2024-06-06 NOTE — Telephone Encounter (Signed)
 Pt scheduled

## 2024-06-13 ENCOUNTER — Ambulatory Visit: Admitting: Medical

## 2024-06-13 ENCOUNTER — Other Ambulatory Visit (HOSPITAL_BASED_OUTPATIENT_CLINIC_OR_DEPARTMENT_OTHER): Payer: Self-pay

## 2024-06-13 VITALS — BP 102/70 | HR 100 | Temp 98.2°F | Resp 18 | Ht 73.0 in | Wt 159.0 lb

## 2024-06-13 DIAGNOSIS — Z79899 Other long term (current) drug therapy: Secondary | ICD-10-CM

## 2024-06-13 DIAGNOSIS — F988 Other specified behavioral and emotional disorders with onset usually occurring in childhood and adolescence: Secondary | ICD-10-CM

## 2024-06-13 MED ORDER — ATOMOXETINE HCL 40 MG PO CAPS
40.0000 mg | ORAL_CAPSULE | Freq: Every day | ORAL | 0 refills | Status: DC
  Filled 2024-06-13: qty 3, 3d supply, fill #0

## 2024-06-13 MED ORDER — ATOMOXETINE HCL 80 MG PO CAPS
80.0000 mg | ORAL_CAPSULE | Freq: Every day | ORAL | 3 refills | Status: DC
  Filled 2024-06-13 – 2024-07-01 (×2): qty 30, 30d supply, fill #0

## 2024-06-13 NOTE — Progress Notes (Signed)
 Subjective:    Patient ID: Corey Sosa, male    DOB: May 10, 1976, 48 y.o.   MRN: [id number]  HPI Corey Sosa is a 48 year old male who presents for a six-month follow-up visit for ADD management.  He recently tried Wellbutrin  for approximately three weeks but discontinued it due to feeling 'weird' and experiencing 'brain fog' in the afternoons. He did not experience negative side effects other than this but felt that his concentration was not significantly improved either. He was taking Wellbutrin  in combination with Vyvanse , which he takes 50 mg in the morning around 7:30 AM. Wellbutrin  was given as potential add on to achieve better concentration.  His Vyvanse  typically wears off around 4:30 to 5:00 PM, at which point he experiences tiredness and brain fog. He has previously been on Concerta  and experienced significant appetite suppression with this medication.  His wife, who is also on Vyvanse , suggested trying Adderall in the afternoon, as it has helped her. However, he has not yet tried this approach.Pt wife is being treated by psychiatrist.    He is currently taking Vyvanse  50 mg daily and recently refilled his prescription five days ago.  He has a history of trying different medications for ADD, including Concerta  and vyvanse , but has not been on Adderall recently. He mentions that his children also have ADD and have been on various medications.  Pt willing to try straterra as an odd on. Currently trying to avoid dose of vyvanse  above 50 mg.   Review of Systems  Constitutional:  Negative for chills and fever.  HENT:  Negative for dental problem.   Cardiovascular:  Negative for chest pain and palpitations.  Gastrointestinal:  Negative for abdominal pain.  Neurological:  Negative for dizziness, seizures, weakness and light-headedness.  Hematological:  Negative for adenopathy.  Psychiatric/Behavioral:  Positive for decreased concentration. Negative for  behavioral problems and sleep disturbance. The patient is not nervous/anxious and is not hyperactive.        Desires better concentration without side effects from meds. Prior decreased appetite on concerta . Note borderline mild tachy today.   Past Medical History:  Diagnosis Date   ADHD (attention deficit hyperactivity disorder)    Anxiety    Arthritis    Shoulder pain      Social History   Socioeconomic History   Marital status: Married    Spouse name: Yolanda   Number of children: Not on file   Years of education: Not on file   Highest education level: Some college, no degree  Occupational History   Not on file  Tobacco Use   Smoking status: Never   Smokeless tobacco: Never  Vaping Use   Vaping status: Never Used  Substance and Sexual Activity   Alcohol use: Yes    Comment: 1 beer twice a week.   Drug use: No   Sexual activity: Yes  Other Topics Concern   Not on file  Social History Narrative   Not on file   Social Drivers of Health   Financial Resource Strain: Medium Risk (06/13/2024)   Overall Financial Resource Strain (CARDIA)    Difficulty of Paying Living Expenses: Somewhat hard  Food Insecurity: No Food Insecurity (06/13/2024)   Hunger Vital Sign    Worried About Running Out of Food in the Last Year: Never true    Ran Out of Food in the Last Year: Never true  Transportation Needs: No Transportation Needs (06/13/2024)   PRAPARE - Transportation  Lack of Transportation (Medical): No    Lack of Transportation (Non-Medical): No  Physical Activity: Sufficiently Active (06/13/2024)   Exercise Vital Sign    Days of Exercise per Week: 7 days    Minutes of Exercise per Session: 60 min  Stress: No Stress Concern Present (06/13/2024)   [employer] of Occupational Health - Occupational Stress Questionnaire    Feeling of Stress: Not at all  Social Connections: Moderately Integrated (06/13/2024)   Social Connection and Isolation Panel    Frequency of  Communication with Friends and Family: Once a week    Frequency of Social Gatherings with Friends and Family: Twice a week    Attends Religious Services: More than 4 times per year    Active Member of [employer] or Organizations: No    Attends Engineer, structural: Not on file    Marital Status: Married  Catering manager Violence: Not on file    Past Surgical History:  Procedure Laterality Date   EXCISION MASS NECK Right 01/15/2024   Procedure: EXCISION POSTERIOR NECK MASS;  Surgeon: Vernetta Berg, MD;  Location: [facility];  Service: General;  Laterality: Right;   NO PAST SURGERIES      Family History  Problem Relation Age of Onset   Cancer Mother    Stroke Father    Heart attack Father     No Known Allergies  Current Outpatient Medications on File Prior to Visit  Medication Sig Dispense Refill   acetaminophen  (TYLENOL ) 500 MG tablet Take 1,000 mg by mouth every 6 (six) hours as needed.     lisdexamfetamine (VYVANSE ) 50 MG capsule Take 1 capsule (50 mg total) by mouth daily. 30 capsule 0   loratadine (CLARITIN) 10 MG tablet Take 10 mg by mouth daily.     meloxicam  (MOBIC ) 15 MG tablet Take 1 tablet (15 mg total) by mouth daily. 30 tablet 0   traMADol  (ULTRAM ) 50 MG tablet Take 1 tablet (50 mg total) by mouth every 6 (six) hours as needed for moderate pain (pain score 4-6) or severe pain (pain score 7-10). 20 tablet 0   No current facility-administered medications on file prior to visit.    BP 102/70   Pulse 100   Temp 98.2 F (36.8 C)   Resp 18   Ht 6' 1 (1.854 m)   Wt 159 lb (72.1 kg)   SpO2 97%   BMI 20.98 kg/m        Objective:   Physical Exam  General- No acute distress. Pleasant patient. Neck- Full range of motion, no jvd Lungs- Clear, even and unlabored. Heart- regular rate and rhythm. Neurologic- CNII- XII grossly intact.       Assessment & Plan:   Patient Instructions  Attention-deficit hyperactivity disorder,  predominantly inattentive type Wellbutrin  discontinued due to adverse effects. Interested in Strattera . - Prescribe Strattera  40 mg for 3 days, then increase to 80 mg on day 4. - Discontinue Wellbutrin . - Monitor insurance approval for Strattera  and its combination with Vyvanse .(already on 50 mg daily). Want to avoid higher dose. - Schedule follow-up in one month to assess response to Strattera . - Instruct him to notify if insurance approves Strattera .

## 2024-06-13 NOTE — Patient Instructions (Signed)
 Attention-deficit hyperactivity disorder, predominantly inattentive type Wellbutrin  discontinued due to adverse effects. Interested in Strattera . - Prescribe Strattera  40 mg for 3 days, then increase to 80 mg on day 4. - Discontinue Wellbutrin . - Monitor insurance approval for Strattera  and its combination with Vyvanse .(already on 50 mg daily). Want to avoid higher dose. - Schedule follow-up in one month to assess response to Strattera . - Instruct him to notify if insurance approves Strattera .

## 2024-06-14 ENCOUNTER — Other Ambulatory Visit: Payer: Self-pay

## 2024-06-15 ENCOUNTER — Ambulatory Visit: Payer: Self-pay | Admitting: Medical

## 2024-06-21 LAB — DM TEMPLATE

## 2024-06-21 LAB — DRUG MONITORING PANEL 376104, URINE
Amphetamine: 10639 ng/mL — ABNORMAL HIGH (ref <250)
Amphetamines: POSITIVE ng/mL — AB (ref <500)
Barbiturates: NEGATIVE ng/mL (ref <300)
Benzodiazepines: NEGATIVE ng/mL (ref <100)
Cocaine Metabolite: NEGATIVE ng/mL (ref <150)
Desmethyltramadol: NEGATIVE ng/mL (ref <100)
Methamphetamine: NEGATIVE ng/mL (ref <250)
Opiates: NEGATIVE ng/mL (ref <100)
Oxycodone: NEGATIVE ng/mL (ref <100)
Tramadol: NEGATIVE ng/mL (ref <100)

## 2024-06-24 ENCOUNTER — Other Ambulatory Visit (HOSPITAL_BASED_OUTPATIENT_CLINIC_OR_DEPARTMENT_OTHER): Payer: Self-pay

## 2024-07-01 ENCOUNTER — Other Ambulatory Visit (HOSPITAL_BASED_OUTPATIENT_CLINIC_OR_DEPARTMENT_OTHER): Payer: Self-pay

## 2024-07-01 ENCOUNTER — Other Ambulatory Visit: Payer: Self-pay | Admitting: Medical

## 2024-07-04 ENCOUNTER — Other Ambulatory Visit: Payer: Self-pay | Admitting: Medical

## 2024-07-04 ENCOUNTER — Other Ambulatory Visit (HOSPITAL_BASED_OUTPATIENT_CLINIC_OR_DEPARTMENT_OTHER): Payer: Self-pay

## 2024-07-04 ENCOUNTER — Other Ambulatory Visit: Payer: Self-pay

## 2024-07-04 MED ORDER — LISDEXAMFETAMINE DIMESYLATE 50 MG PO CAPS
50.0000 mg | ORAL_CAPSULE | Freq: Every day | ORAL | 0 refills | Status: DC
  Filled 2024-07-04: qty 30, 30d supply, fill #0

## 2024-07-04 MED ORDER — MELOXICAM 15 MG PO TABS
15.0000 mg | ORAL_TABLET | Freq: Every day | ORAL | 0 refills | Status: DC
  Filled 2024-07-04: qty 30, 30d supply, fill #0

## 2024-07-04 NOTE — Telephone Encounter (Signed)
Rx refill sent to pt pharmacy 

## 2024-07-05 ENCOUNTER — Other Ambulatory Visit (HOSPITAL_BASED_OUTPATIENT_CLINIC_OR_DEPARTMENT_OTHER): Payer: Self-pay

## 2024-08-04 ENCOUNTER — Other Ambulatory Visit: Payer: Self-pay

## 2024-08-04 ENCOUNTER — Other Ambulatory Visit (HOSPITAL_BASED_OUTPATIENT_CLINIC_OR_DEPARTMENT_OTHER): Payer: Self-pay

## 2024-08-04 ENCOUNTER — Other Ambulatory Visit: Payer: Self-pay | Admitting: Medical

## 2024-08-04 MED ORDER — MELOXICAM 15 MG PO TABS
15.0000 mg | ORAL_TABLET | Freq: Every day | ORAL | 0 refills | Status: DC
  Filled 2024-08-04: qty 30, 30d supply, fill #0

## 2024-08-06 MED ORDER — LISDEXAMFETAMINE DIMESYLATE 50 MG PO CAPS
50.0000 mg | ORAL_CAPSULE | Freq: Every day | ORAL | 0 refills | Status: DC
  Filled 2024-08-06: qty 30, 30d supply, fill #0

## 2024-08-06 NOTE — Telephone Encounter (Signed)
 Rx refill sent. Appointment early feb.

## 2024-08-07 ENCOUNTER — Other Ambulatory Visit (HOSPITAL_BASED_OUTPATIENT_CLINIC_OR_DEPARTMENT_OTHER): Payer: Self-pay

## 2024-08-08 ENCOUNTER — Other Ambulatory Visit (HOSPITAL_BASED_OUTPATIENT_CLINIC_OR_DEPARTMENT_OTHER): Payer: Self-pay

## 2024-08-11 ENCOUNTER — Telehealth: Admitting: Emergency Medicine

## 2024-08-11 DIAGNOSIS — Z711 Person with feared health complaint in whom no diagnosis is made: Secondary | ICD-10-CM

## 2024-08-11 NOTE — Progress Notes (Signed)
 Typically men do not get yeast infections from their male partners who have one. It is not necessary to take medicine to prevent a yeast infection. If you do develop penile discharge or rash, I recommend that you be seen for a face to face visit as this would be highly unusual and you may need testing.  Please contact your primary care physician practice to be seen. Many offices offer virtual options to be seen via video if you are not comfortable going in person to a medical facility at this time.  NOTE: You will NOT be charged for this eVisit.  If you do not have a PCP, [facility] offers a free physician referral service available at [phone]. Our trained staff has the experience, knowledge and resources to put you in touch with a physician who is right for you.    If you are having a true medical emergency please call 911.   Your e-visit answers were reviewed by a board certified advanced clinical practitioner to complete your personal care plan.  Thank you for using e-Visits.

## 2024-09-06 ENCOUNTER — Other Ambulatory Visit: Payer: Self-pay | Admitting: Medical

## 2024-09-06 ENCOUNTER — Encounter: Payer: Self-pay | Admitting: Medical

## 2024-09-06 NOTE — Telephone Encounter (Signed)
 Requesting: Vyvanse  50mg  Contract:06/13/24 UDS: 06/13/24 Last Visit: 06/13/24 Next Visit: None Last Refill: 08/06/24 #30 and 0RF   Please Advise

## 2024-09-12 MED ORDER — MELOXICAM 15 MG PO TABS
15.0000 mg | ORAL_TABLET | Freq: Every day | ORAL | 0 refills | Status: DC
  Filled 2024-09-12: qty 30, 30d supply, fill #0

## 2024-09-12 MED ORDER — LISDEXAMFETAMINE DIMESYLATE 50 MG PO CAPS
50.0000 mg | ORAL_CAPSULE | Freq: Every day | ORAL | 0 refills | Status: DC
  Filled 2024-09-12: qty 30, 30d supply, fill #0

## 2024-09-12 NOTE — Addendum Note (Signed)
 Addended by: DORINA DALLAS HERO on: 09/12/2024 08:47 PM   Modules accepted: Orders

## 2024-09-13 ENCOUNTER — Other Ambulatory Visit (HOSPITAL_BASED_OUTPATIENT_CLINIC_OR_DEPARTMENT_OTHER): Payer: Self-pay

## 2024-09-13 ENCOUNTER — Other Ambulatory Visit: Payer: Self-pay

## 2024-09-14 ENCOUNTER — Ambulatory Visit: Admitting: Medical

## 2024-09-14 VITALS — BP 124/86 | HR 73 | Temp 98.1°F | Resp 15 | Ht 73.0 in | Wt 158.6 lb

## 2024-09-14 DIAGNOSIS — Z639 Problem related to primary support group, unspecified: Secondary | ICD-10-CM

## 2024-09-14 DIAGNOSIS — F419 Anxiety disorder, unspecified: Secondary | ICD-10-CM | POA: Diagnosis not present

## 2024-09-14 DIAGNOSIS — F988 Other specified behavioral and emotional disorders with onset usually occurring in childhood and adolescence: Secondary | ICD-10-CM | POA: Diagnosis not present

## 2024-09-14 DIAGNOSIS — Z79899 Other long term (current) drug therapy: Secondary | ICD-10-CM | POA: Diagnosis not present

## 2024-09-14 NOTE — Progress Notes (Signed)
   Subjective:    Patient ID: Corey Sosa, male    DOB: [birth year], 48 y.o.   MRN: [id number]  HPI  Corey Sosa is a 48 year old male who presents for medication management of ADD.  He currently takes Vyvanse  50 mg in the morning, which helps with concentration but loses effectiveness by late afternoon. He uses caffeine in the evening to maintain focus. Previous trials of Strattera  resulted in severe nausea unless taken with a large meal, and Concerta  significantly reduced his appetite. He has not tried Adderall. Occasional very brief anxiety occurs around financial stress, particularly on paydays, but there is no chronic anxiety or depression, with PHQ-9 and GAD-7 scores of zero.  Pt states he avoids conflicts and therefore does not solve some problems with marriage issues. Wife wants him to go to counseling to help him with their marriage.   Review of Systems See hpi    Objective:   Physical Exam  General- No acute distress. Pleasant patient. Neck- Full range of motion, no jvd Lungs- Clear, even and unlabored. Heart- regular rate and rhythm. Neurologic- CNII- XII grossly intact.       Assessment & Plan:   Patient Instructions  Attention-deficit disorder Vyvanse  effective for concentration but wanes later. Strattera  discontinued due to nausea. Discussed Adderall IR 20 mg BID to address waning effect, considering timing to avoid sleep disruption. - Continue Vyvanse  50 mg until near depletion. - Consider Adderall IR 20 mg BID, first dose 8 AM, second dose 3 PM.(if vyvanse  dc in near future) Let me know if you for sure want to go this route on next refill - Monitor for sleep disruption, adjust timing as needed. - Schedule follow-up one month after starting Adderall.  Adverse effect of Strattera  (atomoxetine ) Severe nausea led to discontinuation, unmanageable despite large meals.  Relationship distress with spouse Distress due to differing backgrounds  and communication issues. Counseling desired, previous unsatisfactory. - Referred to behavioral health for relationship counseling. - Check MyChart for referral details within two weeks.  General Health Maintenance Colonoscopy referral made last year, not scheduled. Family history prompts need for scheduling. - Contact gastroenterology to schedule colonoscopy.  Follow up in about 2 months or sooner if needed   [employer], PA-C

## 2024-09-14 NOTE — Patient Instructions (Signed)
 Attention-deficit disorder Vyvanse  effective for concentration but wanes later. Strattera  discontinued due to nausea. Discussed Adderall IR 20 mg BID to address waning effect, considering timing to avoid sleep disruption. - Continue Vyvanse  50 mg until near depletion. - Consider Adderall IR 20 mg BID, first dose 8 AM, second dose 3 PM.(if vyvanse  dc in near future) Let me know if you for sure want to go this route on next refill - Monitor for sleep disruption, adjust timing as needed. - Schedule follow-up one month after starting Adderall.  Adverse effect of Strattera  (atomoxetine ) Severe nausea led to discontinuation, unmanageable despite large meals.  Relationship distress with spouse Distress due to differing backgrounds and communication issues. Counseling desired, previous unsatisfactory. - Referred to behavioral health for relationship counseling. - Check MyChart for referral details within two weeks.  General Health Maintenance Colonoscopy referral made last year, not scheduled. Family history prompts need for scheduling. - Contact gastroenterology to schedule colonoscopy.  Follow up in about 2 months or sooner if needed

## 2024-10-10 ENCOUNTER — Encounter: Payer: Self-pay | Admitting: Medical

## 2024-10-10 ENCOUNTER — Other Ambulatory Visit (HOSPITAL_BASED_OUTPATIENT_CLINIC_OR_DEPARTMENT_OTHER): Payer: Self-pay

## 2024-10-10 MED ORDER — AMPHETAMINE-DEXTROAMPHETAMINE 20 MG PO TABS
20.0000 mg | ORAL_TABLET | Freq: Two times a day (BID) | ORAL | 0 refills | Status: DC
  Filled 2024-10-10: qty 60, 30d supply, fill #0

## 2024-10-10 NOTE — Addendum Note (Signed)
 Addended by: DORINA DALLAS HERO on: 10/10/2024 12:58 PM   Modules accepted: Orders

## 2024-11-05 ENCOUNTER — Other Ambulatory Visit: Payer: Self-pay | Admitting: Medical

## 2024-11-07 ENCOUNTER — Other Ambulatory Visit (HOSPITAL_BASED_OUTPATIENT_CLINIC_OR_DEPARTMENT_OTHER): Payer: Self-pay

## 2024-11-07 MED ORDER — MELOXICAM 15 MG PO TABS
15.0000 mg | ORAL_TABLET | Freq: Every day | ORAL | 0 refills | Status: AC
  Filled 2024-11-07: qty 30, 30d supply, fill #0

## 2024-11-16 ENCOUNTER — Other Ambulatory Visit (HOSPITAL_BASED_OUTPATIENT_CLINIC_OR_DEPARTMENT_OTHER): Payer: Self-pay

## 2024-11-16 ENCOUNTER — Other Ambulatory Visit: Payer: Self-pay | Admitting: Medical

## 2024-11-16 MED ORDER — AMPHETAMINE-DEXTROAMPHETAMINE 20 MG PO TABS
20.0000 mg | ORAL_TABLET | Freq: Two times a day (BID) | ORAL | 0 refills | Status: AC
  Filled 2024-11-16: qty 60, 30d supply, fill #0

## 2024-11-16 NOTE — Telephone Encounter (Signed)
 Rx refill adderall sent to pt pharmacy

## 2024-11-17 ENCOUNTER — Other Ambulatory Visit (HOSPITAL_BASED_OUTPATIENT_CLINIC_OR_DEPARTMENT_OTHER): Payer: Self-pay

## 2024-11-25 ENCOUNTER — Ambulatory Visit (HOSPITAL_COMMUNITY): Admitting: Licensed Clinical Social Worker

## 2024-11-25 DIAGNOSIS — F4320 Adjustment disorder, unspecified: Secondary | ICD-10-CM | POA: Insufficient documentation

## 2024-11-25 DIAGNOSIS — F4312 Post-traumatic stress disorder, chronic: Secondary | ICD-10-CM | POA: Insufficient documentation

## 2024-11-25 NOTE — Progress Notes (Signed)
 Comprehensive Clinical Assessment (CCA) Note  11/25/2024 Jerilynn GORMAN Bologna III [id number]  Chief Complaint:  Chief Complaint  Patient presents with   Adjustment Disorder    Pt reports problem in his marriage    Post-Traumatic Stress Disorder    Childhood trauma    Visit Diagnosis: Adjustment Disorder unspecified, CPTSD,     Virtual Visit via Video Note  I connected with Jerilynn GORMAN Bologna III on 11/25/24 at 10:00 AM EST by a video enabled telemedicine application and verified that I am speaking with the correct person using two identifiers.  Location: Patient: Pt car that was stationary  Provider: Providers Home [facility]   I discussed the limitations of evaluation and management by telemedicine and the availability of in person appointments. The patient expressed understanding and agreed to proceed.  Client is a 49 year old  male. Client is referred by  PCP for a  communication with spouse and working through trauma.   Client states mental health symptoms as evidenced by:   Depression Difficulty Concentrating; Worthlessness; Hopelessness; Tearfulness Difficulty Concentrating; Worthlessness; Hopelessness; Tearfulness  Duration of Depressive Symptoms Greater than two weeks Greater than two weeks  Mania None None  Anxiety Worrying; Tension Worrying; Tension  Psychosis None None  Trauma Emotional numbing; Detachment from others; Guilt/shame Emotional numbing; Detachment from others; Guilt/shame  Obsessions None None  Compulsions N/A N/A  Inattention Symptoms present in 2 or more settings; Forgetful; Disorganized; Does not seem to listen; Fails to pay attention/makes careless mistakes; Does not follow instructions (not oppositional); Avoids/dislikes activities that require focus; Loses things; Poor follow-through on tasks Symptoms present in 2 or more settings; Forgetful; Disorganized; Does not seem to listen; Fails to pay attention/makes careless mistakes; Does not follow instructions  (not oppositional); Avoids/dislikes activities that require focus; Loses things; Poor follow-through on tasks  Hyperactivity/Impulsivity None None  Oppositional/Defiant Behaviors None None  Emotional Irregularity Chronic feelings of emptiness; Frantic efforts to avoid abandonment Chronic feelings of emptiness; Frantic efforts to avoid abandonment     Client denies suicidal and homicidal ideations   Client denies hallucinations and delusions   Client was screened for the following SDOH:  none flagged today    Client meets criteria for: adjustment disorder, CPTSD   Client states use of the following substances: none reported    S.O.A.P:   SOAP Note - Comprehensive Clinical Assessment S - Subjective  Ubaldo reports he was referred by his primary care physician for treatment related to unresolved childhood trauma and ongoing communication difficulties within his marriage. He reports a history of ADHD, treated for the past six years with stimulant medication, and is currently prescribed Adderall by his primary care physician.  Ubaldo reports significant marital distress related to repeated incidents of infidelity through online communication. He reports a history of problematic pornography use and states he is currently engaging in pornographic games online consistently. He reports difficulty communicating with his partner and seeking emotional connection outside the marriage, including online communication with a long-term friend. Ubaldo reports incidents of online infidelity occurring in 2019, October 2025, and January 2026. He states his wife is aware of all incidents and is currently engaged in her own mental health treatment while attempting to repair the marriage. He reports his wife encouraged him to seek individual therapy. He reports prior participation in online couples counseling, which he found unhelpful.  Ubaldo reports a history of relational trauma involving his father, describing  emotional neglect related to his fathers alcohol abuse, frequent absence due to work, and  lack of support during childhood activities, including sports. He reports ongoing tension in his relationship with his father. He also reports estrangement from his brother since his early 40s, attributing the breakdown in the relationship to differing values after his brother sought a conservative mentor. Ubaldo reports no current relationship with his brother, who resides in [place] .  Ubaldo reports that his mother passed away in 2002/12/07 and describes having had a positive relationship with her prior to her death. He reports having four children with whom he maintains good relationships. 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  Ubaldo denies current or past suicidal or homicidal ideation. He denies delusions, auditory hallucinations, or visual hallucinations. No current safety concerns reported. Ubaldo reports motivation to improve communication with his partner and to address unresolved trauma related to his father.  O - Objective  Ubaldo was alert and oriented 5. He was pleasant, cooperative, and maintained good eye contact throughout the comprehensive clinical assessment. He engaged well in session and was dressed casually with appropriate hygiene. Mood and affect were flat. Speech was normal in rate, tone, and volume. Thought processes were logical, linear, and goal-directed. No evidence of psychosis observed. Judgment and insight appeared fair.  A - Assessment  Diagnostic Impressions:  Complex Post-Traumatic Stress Disorder (CPTSD) Ubaldo meets criteria consistent with CPTSD, evidenced by a history of prolonged interpersonal trauma related to emotional neglect and instability in childhood, ongoing relational difficulties, negative self-concept, emotional constriction, and maladaptive coping behaviors. He endorses persistent  difficulties with affect regulation, interpersonal functioning, and trauma-related relational patterns.  Adjustment Disorder, Unspecified Ubaldo is experiencing clinically significant emotional and behavioral distress in response to current marital stressors, including recent disclosure of infidelity and relationship instability. Symptoms include emotional blunting, impaired communication, and distress impacting relational functioning, without meeting criteria for a more specific adjustment subtype.  Risk Assessment: Ubaldo denies suicidal or homicidal ideation, intent, or plan. No psychotic symptoms noted. Protective factors include stable employment, positive relationships with his children, insight into symptoms, and willingness to engage in treatment. Current risk assessed as low.  P - Plan  Treatment Frequency  Individual psychotherapy to occur weekly, beginning February 4 at 4:00 PM  Treatment Focus  Trauma-informed therapy addressing childhood relational trauma, particularly related to father  Development of emotional awareness and affect regulation  Improvement of communication skills and relational boundaries  Exploration of maladaptive coping strategies related to pornography and online infidelity  Interventions  Psychoeducation on trauma and relational patterns  Skills-based interventions to support emotional regulation and interpersonal effectiveness  Supportive therapy addressing current marital stressors  Coordination of Care  Continue medication management with primary care physician for ADHD  Monitor impact of ADHD symptoms on emotional regulation and relationships    Follow-Up  Ongoing assessment of symptoms, risk, and treatment progress  Adjust treatment goals and interventions as clinically indicated  Clinician assisted client with scheduling the following appointments: Feb 4th 4pm virtual .   Client agreed with treatment recommendations.    I  discussed the assessment and treatment plan with the patient. The patient was provided an opportunity to ask questions and all were answered. The patient agreed with the plan and demonstrated an understanding of the instructions.   The patient was advised to call back or seek an in-person evaluation if the symptoms worsen or if the condition fails to improve as anticipated.  I provided 50 minutes of non-face-to-face time during this encounter.   Juliene  GORMAN Patee, LCSW    CCA Screening, Triage and Referral (STR)  Patient Reported Information Referral name: Dorina Dallas RIGGERS  Referral phone number: [phone]   Whom do you see for routine medical problems? Primary Care  Name of Contact: Saguier, [place], PA-C  How Long Has This Been Causing You Problems? > than 6 months (13 years)  What Do You Feel Would Help You the Most Today? Treatment for Depression or other mood problem; Stress Management   Have You Recently Been in Any Inpatient Treatment (Hospital/Detox/Crisis Center/28-Day Program)? No   Have You Ever Received Services From [facility] Before? Yes  Who Do You See at [facility]? Multiple services   Have You Recently Had Any Thoughts About Hurting Yourself? No  Are You Planning to Commit Suicide/Harm Yourself At This time? No   Have you Recently Had Thoughts About Hurting Someone [place]? No   Do You Currently Have a Therapist/Psychiatrist? No  Have You Been Recently Discharged From Any Office Practice or Programs? No   CCA Screening Triage Referral Assessment Type of Contact: Tele-Assessment  Is this Initial or Reassessment? Initial Assessment  Date Telepsych consult ordered in [facility]:  11/25/24  Is CPS involved or ever been involved? Never  Is APS involved or ever been involved? Never   Patient Determined To Be At Risk for Harm To Self or Others Based on Review of Patient Reported Information or Presenting Complaint? No  Method: No Plan  Availability of  Means: No access or NA  Intent: Vague intent or NA  Notification Required: No need or identified person  Additional Information for Danger to Others Potential: Previous attempts Are There Guns or Other Weapons in Your Home? No  Types of Guns/Weapons: No data recorded Are These Weapons Safely Secured?                            No  Location of Assessment: Other (comment) (Pt stationary car)  Does Patient Present under Involuntary Commitment? No  [place] of Residence: Other (Comment) [place])  Options For Referral: Outpatient Therapy  CCA Biopsychosocial Intake/Chief Complaint:  Patient reports a history of online/chat infidelity on his part within the marriage. Both the patient and his spouse are currently engaged in therapy to improve communication and relational functioning.  Patient has a history of ADHD, which is currently being managed by his primary care provider with Adderall.  Patient also reports a history of trauma related to an emotionally and physically absent father, attributed to the fathers work demands and alcohol and other drug (AOD) use.  Current Symptoms/Problems: ADHD syptoms being treated over the last 6 years by PCP. Pt has poor communication skills and wants to process through childhood trauma   Patient Reported Schizophrenia/Schizoaffective Diagnosis in Past: No   Strengths: willing to engage in treatment  Preferences: therapy  Abilities: none reported   Mental Health Symptoms Depression:  Difficulty Concentrating; Worthlessness; Hopelessness; Tearfulness   Duration of Depressive symptoms: Greater than two weeks   Mania:  None   Anxiety:   Worrying; Tension   Psychosis:  None   Duration of Psychotic symptoms: No data recorded  Trauma:  Emotional numbing; Detachment from others; Guilt/shame   Obsessions:  None   Compulsions:  N/A   Inattention:  Symptoms present in 2 or more settings; Forgetful; Disorganized; Does not seem to listen; Fails  to pay attention/makes careless mistakes; Does not follow instructions (not oppositional); Avoids/dislikes activities that require focus; Loses  things; Poor follow-through on tasks   Hyperactivity/Impulsivity:  None   Oppositional/Defiant Behaviors:  None   Emotional Irregularity:  Chronic feelings of emptiness; Frantic efforts to avoid abandonment   Other Mood/Personality Symptoms:  No data recorded   Mental Status Exam Appearance and self-care  Stature:  Average   Weight:  Average weight   Clothing:  Casual   Grooming:  Normal   Cosmetic use:  None   Posture/gait:  Normal   Motor activity:  Not Remarkable   Sensorium  Attention:  Normal   Concentration:  Normal   Orientation:  X5   Recall/memory:  Normal   Affect and Mood  Affect:  Anxious; Flat   Mood:  Anxious   Relating  Eye contact:  Normal   Facial expression:  No data recorded  Attitude toward examiner:  Cooperative   Thought and Language  Speech flow: Clear and Coherent   Thought content:  Appropriate to Mood and Circumstances   Preoccupation:  No data recorded  Hallucinations:  None   Organization:  No data recorded  [employer] of Knowledge:  Fair   Intelligence:  Average   Abstraction:  Functional   Judgement:  Fair   Reality Testing:  No data recorded  Insight:  Fair   Decision Making:  Normal   Social Functioning  Social Maturity:  Isolates   Social Judgement:  Normal   Stress  Stressors:  Relationship; Other (Comment) (trauma from childhood with father)   Coping Ability:  Exhausted   Skill Deficits:  Communication; Self-control; Interpersonal   Supports:  Church; Family; Friends/Service system     Religion: Religion/Spirituality Are You A Religious Person?: Yes What is Your Religious Affiliation?: Protestant  Leisure/Recreation: Leisure / Recreation Do You Have Hobbies?: Yes Leisure and Hobbies: djing,mixed live band, church, play  base  Exercise/Diet: Exercise/Diet Do You Exercise?: Yes What Type of Exercise Do You Do?: Other (Comment) (work as furniture conservator/restorer) How Many Times a Week Do You Exercise?: 4-5 times a week Have You Gained or Lost A Significant Amount of Weight in the Past Six Months?: No Do You Follow a Special Diet?: No Do You Have Any Trouble Sleeping?: No   CCA Employment/Education Employment/Work Situation: Employment / Work Situation Employment Situation: Employed Where is Patient Currently Employed?: [employer] How Long has Patient Been Employed?: 4  years Are You Satisfied With Your Job?: Yes Do You Work More Than One Job?: No Work Stressors: Development Worker, International Aid Job has Been Impacted by Current Illness: No What is the Longest Time Patient has Held a Job?: 4 year Where was the Patient Employed at that Time?: current employment Has Patient ever Been in the [employer]?: No  Education: Education Is Patient Currently Attending School?: No Last Grade Completed: 12 Did Garment/textile Technologist From [employer]?: Yes Did Theme Park Manager?: Yes What Type of College Degree Do you Have?: community college but did not finish Did You Have An Individualized Education Program (IIEP): No Did You Have Any Difficulty At [employer]?: No Patient's Education Has Been Impacted by Current Illness: No   CCA Family/Childhood History Family and Relationship History: Family history Marital status: Married Number of Years Married: 14 What types of issues is patient dealing with in the relationship?: Pt reports emitional infadelity on his part Are you sexually active?: Yes What is your sexual orientation?: hetrosexual Does patient have children?: Yes How many children?: 4 How is patient's relationship with their children?: 12,9,8,3: Good with all 4  Childhood History:  Childhood History By whom was/is the patient raised?: Both parents Description of patient's relationship with caregiver when they were a child: Mother:  [place] Father: sufered from alcohol absue and working all the time. Patient's description of current relationship with people who raised him/her: mother pass in 2004 father: family dinners but nothing like a father/son should be How were you disciplined when you got in trouble as a child/adolescent?: spanked Does patient have siblings?: Yes Number of Siblings: 1 Description of patient's current relationship with siblings: Brother: Does not really talk too. Pt reports they got along through college then brother was influenced by an older guy he went to church with and now they do not see eye to eye poltically Did patient suffer any verbal/emotional/physical/sexual abuse as a child?: No Did patient suffer from severe childhood neglect?: No Has patient ever been sexually abused/assaulted/raped as an adolescent or adult?: No Was the patient ever a victim of a crime or a disaster?: No Witnessed domestic violence?: No Has patient been affected by domestic violence as an adult?: No  Child/Adolescent Assessment:     CCA Substance Use Alcohol/Drug Use: Alcohol / Drug Use History of alcohol / drug use?: No history of alcohol / drug abuse   DSM5 Diagnoses: Patient Active Problem List   Diagnosis Date Noted   Adjustment disorder 11/25/2024   Chronic post-traumatic stress disorder (PTSD) 11/25/2024   Attention and concentration deficit 01/05/2018      Collaboration of Care: Other Pt to start individual therapy at [facility] weekly   Patient/Guardian was advised Release of Information must be obtained prior to any record release in order to collaborate their care with an outside provider. Patient/Guardian was advised if they have not already done so to contact the registration department to sign all necessary forms in order for us  to release information regarding their care.   Consent: Patient/Guardian gives verbal consent for treatment and assignment of benefits for services  provided during this visit. Patient/Guardian expressed understanding and agreed to proceed.   Harry Bark S Trenese Haft, LCSW

## 2024-11-30 ENCOUNTER — Ambulatory Visit (HOSPITAL_COMMUNITY): Admitting: Licensed Clinical Social Worker

## 2024-11-30 DIAGNOSIS — F4312 Post-traumatic stress disorder, chronic: Secondary | ICD-10-CM

## 2024-11-30 DIAGNOSIS — F4320 Adjustment disorder, unspecified: Secondary | ICD-10-CM

## 2024-11-30 NOTE — Progress Notes (Unsigned)
 "  THERAPIST PROGRESS NOTE   Virtual Visit via Video Note  I connected with Corey Sosa on 11/30/24 at  4:00 PM EST by a video enabled telemedicine application and verified that I am speaking with the correct person using two identifiers.  Location: Patient: Pt work [employer]ist: [employer]    I discussed the limitations of evaluation and management by telemedicine and the availability of in person appointments. The patient expressed understanding and agreed to proceed.    I discussed the assessment and treatment plan with the patient. The patient was provided an opportunity to ask questions and all were answered. The patient agreed with the plan and demonstrated an understanding of the instructions.   The patient was advised to call back or seek an in-person evaluation if the symptoms worsen or if the condition fails to improve as anticipated.  I provided 55 minutes of non-face-to-face time during this encounter.  Juliene GORMAN Patee, LCSW   Participation Level: Active  Behavioral Response: CasualAlertAnxious and Depressed  Type of Therapy: Individual Therapy  Treatment Goals addressed:  Active     BH CCP Acute or Chronic Trauma Reaction     LTG: Recall traumatic events without becoming overwhelmed with negative emotions (Progressing)     Start:  11/25/24    Expected End:  04/25/25         STG: Corey Sosa will describe the signs and symptoms of PTSD that are experienced and how they interfere with daily living (Progressing)     Start:  11/25/24    Expected End:  04/25/25         STG: Corey Sosa will identify internal and external stimuli that trigger PTSD symptoms (Progressing)     Start:  11/25/24    Expected End:  04/25/25         STG: Corey Sosa will acknowledge that healing from PTSD is a gradual process (Progressing)     Start:  11/25/24    Expected End:  04/25/25         STG: Corey Sosa will identify negative  coping strategies that have been used to cope with the feelings associated with the trauma (Progressing)     Start:  11/25/24    Expected End:  04/25/25           Social Interpersonal Effectiveness     LTG: Corey Sosa will attend and participate in therapeutic, recreational and educational activities that support interpersonal effectiveness       Start:  11/25/24    Expected End:  04/25/25         LTG: Corey Sosa will recognize socially inappropriate behaviors and develop alternative behaviors     Start:  11/25/24    Expected End:  04/25/25         STG: Corey Sosa will reduce frequency of avoidant behaviors by 50% as evidenced by self-report in therapy sessions     Start:  11/25/24    Expected End:  04/25/25         STG: Corey Sosa will demonstrate interest in social activities by initiating/joining social activity without prompts     Start:  11/25/24    Expected End:  04/25/25            ProgressTowards Goals: Progressing  Interventions: CBT and Supportive  Suicidal/Homicidal: Nowithout intent/plan  Therapist Response:   SBETHA Sosa was alert and oriented 5. He was pleasant and cooperative, maintained good eye contact, and engaged well  throughout the therapy session. 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 Corey Sosa presented with an anxious and flat mood with appropriate affect.  Corey Sosa reports his primary stressor is ongoing communication difficulties with his wife. He disclosed engaging in emotional infidelity, consisting of flirtatious text messaging with another woman. He denies any physical contact or sexting. Corey Sosa reports a history of poor communication within his marriage, which he connects to his upbringing, specifically a lack of emotional engagement and verbal affirmation from his father. He stated that his wife recently learned new details about his childhood that he had previously withheld.  Corey Sosa reported  that emotional infidelity has occurred a total of three times over the past five years. He expressed a strong desire to reconcile his marriage and improve communication. He and his wife have begun listening to podcasts together and reading self-help articles to support relationship growth.  O:  Client appeared attentive and engaged. Thought process was logical and goal-directed. No evidence of perceptual disturbances. Insight and motivation for change appear present.  A:  Corey Sosa is experiencing relational distress related to communication deficits, unresolved developmental experiences, and maladaptive coping behaviors. Emotional infidelity appears to be a recurrent pattern linked to unmet emotional needs and difficulty with vulnerability and direct communication. Client demonstrates motivation for change and willingness to engage in therapeutic interventions.  P / Intervention & Plan:  LCSW provided psychoeducation on effective communication techniques. LCSW introduced gratitude-based exercises, including grateful contemplation and positive affirmations. LCSW encouraged structured, in-person communication with partner by implementing technology-free time for 10 minutes nightly, gradually increasing frequency. Client to practice gratitude exercises at least once weekly. Client to engage in technology-free communication with partner at least once weekly, building toward daily practice. LCSW will continue psychodynamic therapy to support emotional expression in a nonjudgmental environment. LCSW utilized supportive therapy to provide validation, praise, and encouragement. Client to follow up with LCSW in one week.   Plan: Return again in 3 weeks.  Diagnosis: Adjustment disorder, unspecified type  Chronic post-traumatic stress disorder (PTSD)  Collaboration of Care: Other continued individual therapy    Patient/Guardian was advised Release of Information must be obtained prior to any record  release in order to collaborate their care with an outside provider. Patient/Guardian was advised if they have not already done so to contact the registration department to sign all necessary forms in order for us  to release information regarding their care.   Consent: Patient/Guardian gives verbal consent for treatment and assignment of benefits for services provided during this visit. Patient/Guardian expressed understanding and agreed to proceed.   Juliene GORMAN Patee, LCSW 11/30/2024  "

## 2024-12-09 ENCOUNTER — Ambulatory Visit (HOSPITAL_COMMUNITY): Admitting: Licensed Clinical Social Worker

## 2024-12-14 ENCOUNTER — Ambulatory Visit (HOSPITAL_COMMUNITY): Admitting: Licensed Clinical Social Worker
